# Patient Record
Sex: Female | Born: 1990 | Race: White | Hispanic: No | Marital: Single | Smoking: Former smoker
Health system: Southern US, Community
[De-identification: ages and names within clinical notes are randomized; demographics above are authoritative.]

## PROBLEM LIST (undated history)

## (undated) DIAGNOSIS — I10 Essential (primary) hypertension: Secondary | ICD-10-CM

## (undated) DIAGNOSIS — S4990XA Unspecified injury of shoulder and upper arm, unspecified arm, initial encounter: Secondary | ICD-10-CM

## (undated) DIAGNOSIS — Z7151 Drug abuse counseling and surveillance of drug abuser: Secondary | ICD-10-CM

## (undated) DIAGNOSIS — S99919A Unspecified injury of unspecified ankle, initial encounter: Secondary | ICD-10-CM

## (undated) DIAGNOSIS — R51 Headache: Secondary | ICD-10-CM

## (undated) HISTORY — DX: Drug abuse counseling and surveillance of drug abuser: Z71.51

## (undated) HISTORY — DX: Headache: R51

## (undated) HISTORY — DX: Unspecified injury of unspecified ankle, initial encounter: S99.919A

## (undated) HISTORY — DX: Unspecified injury of shoulder and upper arm, unspecified arm, initial encounter: S49.90XA

---
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
## 2008-03-25 ENCOUNTER — Ambulatory Visit: Payer: Self-pay | Admitting: Internal Medicine

## 2008-03-25 DIAGNOSIS — R599 Enlarged lymph nodes, unspecified: Secondary | ICD-10-CM | POA: Insufficient documentation

## 2008-03-25 DIAGNOSIS — R5383 Other fatigue: Secondary | ICD-10-CM

## 2008-03-25 DIAGNOSIS — R5381 Other malaise: Secondary | ICD-10-CM | POA: Insufficient documentation

## 2008-04-03 LAB — CONVERTED CEMR LAB
Basophils Absolute: 0 10*3/uL (ref 0.0–0.1)
Basophils Relative: 0.1 % (ref 0.0–3.0)
Free T4: 1.1 ng/dL (ref 0.6–1.6)
MCHC: 34.5 g/dL (ref 30.0–36.0)
MCV: 95.8 fL (ref 78.0–100.0)
T3, Free: 4.5 pg/mL — ABNORMAL HIGH (ref 2.3–4.2)
TSH: 4.64 microintl units/mL (ref 0.35–5.50)
WBC: 9.7 10*3/uL (ref 4.5–10.5)

## 2008-05-02 DIAGNOSIS — Z7151 Drug abuse counseling and surveillance of drug abuser: Secondary | ICD-10-CM

## 2008-05-02 HISTORY — DX: Drug abuse counseling and surveillance of drug abuser: Z71.51

## 2008-05-07 ENCOUNTER — Ambulatory Visit: Payer: Self-pay | Admitting: Internal Medicine

## 2008-05-07 LAB — CONVERTED CEMR LAB
T3, Free: 3.2 pg/mL (ref 2.3–4.2)
TSH: 1.9 microintl units/mL (ref 0.35–5.50)

## 2008-05-14 ENCOUNTER — Ambulatory Visit: Payer: Self-pay | Admitting: Internal Medicine

## 2008-05-14 DIAGNOSIS — R946 Abnormal results of thyroid function studies: Secondary | ICD-10-CM | POA: Insufficient documentation

## 2008-05-14 DIAGNOSIS — F4323 Adjustment disorder with mixed anxiety and depressed mood: Secondary | ICD-10-CM

## 2008-05-14 DIAGNOSIS — F39 Unspecified mood [affective] disorder: Secondary | ICD-10-CM

## 2008-06-08 ENCOUNTER — Emergency Department (HOSPITAL_COMMUNITY): Admission: EM | Admit: 2008-06-08 | Discharge: 2008-06-08 | Payer: Self-pay | Admitting: Family Medicine

## 2008-06-18 ENCOUNTER — Telehealth: Payer: Self-pay | Admitting: Internal Medicine

## 2009-07-03 ENCOUNTER — Ambulatory Visit: Payer: Self-pay | Admitting: Internal Medicine

## 2009-07-03 DIAGNOSIS — F17200 Nicotine dependence, unspecified, uncomplicated: Secondary | ICD-10-CM

## 2009-07-03 DIAGNOSIS — F1921 Other psychoactive substance dependence, in remission: Secondary | ICD-10-CM | POA: Insufficient documentation

## 2009-07-03 DIAGNOSIS — J301 Allergic rhinitis due to pollen: Secondary | ICD-10-CM

## 2009-08-18 ENCOUNTER — Telehealth: Payer: Self-pay | Admitting: Internal Medicine

## 2009-08-31 ENCOUNTER — Telehealth: Payer: Self-pay | Admitting: Internal Medicine

## 2009-09-21 ENCOUNTER — Telehealth: Payer: Self-pay | Admitting: *Deleted

## 2009-09-22 ENCOUNTER — Ambulatory Visit: Payer: Self-pay | Admitting: Internal Medicine

## 2009-09-22 ENCOUNTER — Telehealth: Payer: Self-pay | Admitting: Internal Medicine

## 2009-09-22 DIAGNOSIS — H6690 Otitis media, unspecified, unspecified ear: Secondary | ICD-10-CM | POA: Insufficient documentation

## 2009-09-23 ENCOUNTER — Encounter: Payer: Self-pay | Admitting: Internal Medicine

## 2009-10-12 ENCOUNTER — Telehealth: Payer: Self-pay | Admitting: Internal Medicine

## 2010-01-05 ENCOUNTER — Telehealth: Payer: Self-pay | Admitting: Internal Medicine

## 2010-01-08 ENCOUNTER — Ambulatory Visit: Payer: Self-pay | Admitting: Internal Medicine

## 2010-01-08 DIAGNOSIS — K5289 Other specified noninfective gastroenteritis and colitis: Secondary | ICD-10-CM

## 2010-01-08 DIAGNOSIS — R21 Rash and other nonspecific skin eruption: Secondary | ICD-10-CM

## 2010-01-11 ENCOUNTER — Ambulatory Visit: Payer: Self-pay | Admitting: Internal Medicine

## 2010-01-11 DIAGNOSIS — L089 Local infection of the skin and subcutaneous tissue, unspecified: Secondary | ICD-10-CM | POA: Insufficient documentation

## 2010-01-12 ENCOUNTER — Encounter: Payer: Self-pay | Admitting: Internal Medicine

## 2010-06-01 NOTE — Progress Notes (Signed)
Summary: ear pain  Phone Note Call from Patient   Caller: Daughter Call For: Madelin Headings MD Summary of Call: Okey Regal) pt's mother called stating her daughter  [facility].  She is having pain again today, and is asking to see an ENT or Dr. Fabian Sharp.  Explained that tomorrow would be the first appt for Dr. Fabian Sharp, and if she is in a lot of pain, she may go back [facility] for [facility].   All of this was communicated through several voice mails. [phone] Initial call taken by: Lynann Beaver CMA,  Sep 21, 2009 12:42 PM  Follow-up for Phone Call        I can see her tomorrow for this Follow-up by: Nelwyn Salisbury MD,  Sep 21, 2009 4:09 PM  Additional Follow-up for Phone Call Additional follow up Details #1::        SPoke to pt's mom and she is going to [employer] on Wed. Additional Follow-up by: Romualdo Bolk, CMA (AAMA),  Sep 21, 2009 4:11 PM

## 2010-06-01 NOTE — Assessment & Plan Note (Signed)
Summary: painful cyst in vaginal area/ssc   Vital Signs:  Patient profile:   20 year old female Menstrual status:  regular Weight:      127 pounds Temp:     98.7 degrees F oral Pulse rate:   78 / minute BP sitting:   120 / 80  (right arm) Cuff size:   regular  Vitals Entered By: Romualdo Bolk, CMA (AAMA) (January 11, 2010 3:45 PM) CC: Cyst in vaginal area top middle, swollen and painful. Came up on 9/12. Pt's about the size of a small pea and has gone down. Pt saw some pus coming out of it.    History of Present Illness: Kim Brooks comes in today for an acute visit because she has had a sore lump in her vaginal area for two days. She has treated it with nothing. Earlier today there was some mild pussy drainage and the swelling went down on her pain decreased a bit. She's not had this problem before. There is no fever vaginal discharge history of HSV other maybe exposures at some point.   She was unsure how to treat this and comes in today her diarrheal illness is recovered see previous notes.  Preventive Screening-Counseling & Management  Alcohol-Tobacco     Alcohol drinks/day: 0     Smoking Status: current     Packs/Day: 0.5  Caffeine-Diet-Exercise     Caffeine use/day: 3     Does Patient Exercise: yes  Current Medications (verified): 1)  Nasonex 50 Mcg/act Susp (Mometasone Furoate) .... 2 Sprays Each Nostril Q D  For  Allergy  Control 2)  Cetirizine Hcl 10 Mg Tabs (Cetirizine Hcl) .Marland Kitchen.. 1 By Mouth Once Daily For Allergy  Allergies (verified): No Known Drug Allergies  Past History:  Past medical, surgical, family and social histories (including risk factors) reviewed, and no changes noted (except as noted below).  Past Medical History: Reviewed history from 01/08/2010 and no changes required. see  paper chart no change Shoulder ankle injuries  Recent neg testing for HIV  Headache drug opiate rehab  2010     Consults None   Past History:  Care  Management: None Current ENT: [facility]  Family History: Reviewed history from 05/14/2008 and no changes required. DM CV    Alcohol /SUbstance use  Social History: Reviewed history from 01/08/2010 and no changes required. tobacco   1/2 ppd   and caffeine.      hh of 2  no pets.   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  Review of Systems  The patient denies anorexia, fever, abdominal pain, hematuria, incontinence, genital sores, abnormal bleeding, enlarged lymph nodes, and angioedema.    Physical Exam  General:  Well-developed,well-nourished,in no acute distress; alert,appropriate and cooperative throughout examination Abdomen:  Bowel sounds positive,abdomen soft and non-tender without masses, organomegaly or hernias noted. Genitalia:  no vaginal discharge and mucosa pink and moist.  there is a 2 mm subcutaneous red nodule with a point excoriation and no discharge and no fluctuates. There is no ulceration is moderately tender. There is no secondary adenopathy or redness. This is located at the upper right labial area in the pubic region. Pulses:  pulses intact without delay   Extremities:  no clubbing cyanosis or edema  Neurologic:  non focal  Skin:  turgor normal and color normal.   Cervical Nodes:  No lymphadenopathy noted Inguinal Nodes:  No significant adenopathy Psych:  Oriented X3, good eye contact, not  anxious appearing, and not depressed appearing.     Impression & Recommendations:  Problem # 1:  INFECTION, SKIN AND SOFT TISSUE (ICD-686.9) this appears to be more bacterial and not herpetic. It is not an abscess at present but more like a tiny boil. All cultured discharge surface and empirically treat in the meantime.  Expectant management .   Plan follow-up if progressive or persistent Orders: T-Culture, Wound ([id number])  Complete Medication List: 1)  Nasonex 50 Mcg/act Susp (Mometasone furoate) .... 2 sprays each nostril q d   for  allergy  control 2)  Cetirizine Hcl 10 Mg Tabs (Cetirizine hcl) .Marland Kitchen.. 1 by mouth once daily for allergy 3)  Cephalexin 500 Mg Caps (Cephalexin) .Marland Kitchen.. 1 by mouth two times a day  for skin infection  Patient Instructions: 1)  warm/hot  compresses   at  least 3 x per day until better  2)  topical antibiotic such as polysproin. 3)  You will be informed of lab results when available.  4)  oral antibioitic for 5 days ( but may get better with above )  5)  call if not resolving in the next  4-5 days or as needed. Prescriptions: CEPHALEXIN 500 MG CAPS (CEPHALEXIN) 1 by mouth two times a day  for skin infection  #10 x 0   Entered and Authorized by:   Madelin Headings MD   Signed by:   Madelin Headings MD on 01/11/2010   Method used:   Print then Give to Patient   RxID:   [id number]

## 2010-06-01 NOTE — Progress Notes (Signed)
Summary: OOW note  Phone Note Call from Patient   Caller: Patient Call For: Kim Headings MD Summary of Call: Rexene Agent (Mom)  Pt was out of school last week with vomiting, diarrhea and sore throat.  Did not go to the doctor as they felt it was viral. Her college is requiring a note to be out.  Would Dr. Fabian Sharp do this? [phone] Initial call taken by: Lynann Beaver CMA,  January 05, 2010 12:24 PM  Follow-up for Phone Call        I dont  usually write notes without seeing patient or otherwise being involved in care of a given  illness.    Suggest OV . (or  if all better ..usually talk with professors themselves   about situation .)  Follow-up by: Kim Headings MD,  January 06, 2010 9:32 AM  Additional Follow-up for Phone Call Additional follow up Details #1::        Left message on personal voice mail. Additional Follow-up by: Lynann Beaver CMA,  January 06, 2010 11:04 AM

## 2010-06-01 NOTE — Progress Notes (Signed)
Summary: letter of support  Phone Note Call from Patient   Caller: mother,carol [phone] Summary of Call: Need a letter in support of insurance appeal re substance abuse & network exception for her.  You saw her 1st of last year & concerned very depressed.  Lots of in network mental health.  Faxing 3 page appeal she has written, copy coming to you, & request your letter of support for it.   Initial call taken by: Rudy Jew, RN,  August 18, 2009 9:55 AM  Follow-up for Phone Call        letter of appeal in doc folder Follow-up by: Heron Sabins,  August 18, 2009 10:56 AM  Additional Follow-up for Phone Call Additional follow up Details #1::        will do this  after review Additional Follow-up by: Madelin Headings MD,  August 20, 2009 8:01 AM

## 2010-06-01 NOTE — Assessment & Plan Note (Signed)
Summary: F/U POST GI ILLNESS // RS   Vital Signs:  Patient profile:   20 year old female Menstrual status:  regular LMP:     12/26/2009 Weight:      129 pounds Pulse rate:   72 / minute BP sitting:   100 / 70  (right arm) Cuff size:   regular  Vitals Entered By: Romualdo Bolk, CMA (AAMA) (January 08, 2010 1:58 PM) CC: Rash on Left arm, occ it does it itch. Pt has to wear rubber gloves at school. It has been there for 1 week. Pt also needs a md note for school for Aug 29-9/1 due to having nausea, vomiting and diarrhea. Pt states that she felt like she didn't need to come in because it was just a bug that was going around. LMP (date): 12/26/2009 LMP - Character: normal Menarche (age onset years): 10   Menses interval (days): 28 Menstrual flow (days): 5 Enter LMP: 12/26/2009   History of Present Illness: Kim Brooks  comes in today   for above .  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  she needs note for her teachers.   ONset with malaise August 29 and then had  to go home from school with nausea   August 30th .      Persistent vomiting, diarrhea  occurred   august 31 and sept 1  and stayed home .     took pepto.    vomiting stopped aafter  3 days .   Slow improvement and although tired went back to school this wekk    No diarrhea for the past  2 days.      Friend in recovery group got sick.    no other exposures or travel. No work outside of school.  LMP last week.   Also has  itchy  antecupbital rash  left    . she wears  long rubber gloves . at school . no known latex allergy. no fever  no other rashes.     Preventive Screening-Counseling & Management  Alcohol-Tobacco     Alcohol drinks/day: 0     Smoking Status: current     Packs/Day: 0.5  Caffeine-Diet-Exercise     Caffeine use/day: 3     Does Patient Exercise: yes  Current Medications (verified): 1)  Nasonex 50 Mcg/act Susp (Mometasone Furoate)  .... 2 Sprays Each Nostril Q D  For  Allergy  Control 2)  Cetirizine Hcl 10 Mg Tabs (Cetirizine Hcl) .Marland Kitchen.. 1 By Mouth Once Daily For Allergy  Allergies (verified): No Known Drug Allergies  Past History:  Past medical, surgical, family and social histories (including risk factors) reviewed for relevance to current acute and chronic problems.  Past Medical History: see  paper chart no change Shoulder ankle injuries  Recent neg testing for HIV  Headache drug opiate rehab  2010     Consults None   Past History:  Care Management: None Current ENT: [facility]  Family History: Reviewed history from 05/14/2008 and no changes required. DM CV    Alcohol /SUbstance use  Social History: Reviewed history from 07/03/2009 and no changes required. tobacco   1/2 ppd   and caffeine.      hh of 2  no pets.   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  Review of Systems  The patient denies fever, chest pain, melena, hematochezia, severe indigestion/heartburn,  hematuria, difficulty walking, and abnormal bleeding.         fatigue dizziness now better   Physical Exam  General:  Well-developed,well-nourished,in no acute distress; alert,appropriate and cooperative throughout examination Head:  normocephalic and atraumatic.   Eyes:  vision grossly intact, pupils equal, and pupils round.   Nose:  no external deformity and no external erythema.   Mouth:  pharynx pink and moist.  no lesions Neck:  No deformities, masses, or tenderness noted. Lungs:  Normal respiratory effort, chest expands symmetrically. Lungs are clear to auscultation, no crackles or wheezes. Heart:  Normal rate and regular rhythm. S1 and S2 normal without gallop, murmur, click, rub or other extra sounds. Abdomen:  Bowel sounds positive,abdomen soft and non-tender without masses, organomegaly or hernias noted. Pulses:  nl cap refill  Extremities:  no clubbing cyanosis or edema  Neurologic:  non  focal  Skin:  turgor normal and color normal.  left antecubital area with papular  rash no pustules   or vesicles  Cervical Nodes:  small left ac node mobile ( old) nontender Axillary Nodes:  No palpable lymphadenopathy Psych:  Oriented X3, normally interactive, good eye contact, not anxious appearing, and not depressed appearing.     Impression & Recommendations:  Problem # 1:  GASTROENTERITIS (ICD-558.9) convalescing   agree with the 3 days taken from school   in a physical curriculum .   Expectant management   Problem # 2:  RASH (ICD-782.1) poss contact derm    use otc for now  and call if persistent or  progressive   for poss stronger steroid topical   Problem # 3:  TOBACCO USE (ICD-305.1)  disc dc     in context .     Orders: Tobacco use cessation intermediate 3-10 minutes (99406)  Complete Medication List: 1)  Nasonex 50 Mcg/act Susp (Mometasone furoate) .... 2 sprays each nostril q d  for  allergy  control 2)  Cetirizine Hcl 10 Mg Tabs (Cetirizine hcl) .Marland Kitchen.. 1 by mouth once daily for allergy  Patient Instructions: 1)  can use  hydrocortisone   1%  two times a day  for the rash  call if not helping after 1-2 weeks orf rx. 2)  Advance diet as tolerated  call if GI symptoms return.

## 2010-06-01 NOTE — Progress Notes (Signed)
Summary: letter of support  Phone Note Call from Patient Call back at [phone]   Caller: Mom-Carol Summary of Call: Mom is calling about the letter. Mom is going to write the letter up and email it to [place] to let us know what needs to be said. Initial call taken by: Romualdo Bolk, CMA (AAMA),  October 12, 2009 10:23 AM

## 2010-06-01 NOTE — Assessment & Plan Note (Signed)
Summary: ear pain/dm  of History of Present Illness: 20 year old patient, who is in today complaining of bilateral ear pain.  She has been at the urgent care a number of times and has been treated with multiple antibiotics.  Over the past few months.  She is scheduled for ENT evaluation tomorrow.  She complains of ear pain, but no drainage.  She does have a history of substance abuse in the past.    Allergies: No Known Drug Allergies  Past History:  Past Medical History: Reviewed history from 07/03/2009 and no changes required. see  paper chart no change Shoulder ankle injuries  Recent neg testing for HIV  Headache drug opiate rehab  2010    Consults None   Physical Exam  General:  Well-developed,well-nourished,in no acute distress; alert,appropriate and cooperative throughout examination Head:  Normocephalic and atraumatic without obvious abnormalities. No apparent alopecia or balding. Eyes:  No corneal or conjunctival inflammation noted. EOMI. Perrla. Funduscopic exam benign, without hemorrhages, exudates or papilledema. Vision grossly normal. Ears:  both tympanic membranes appeared to be dull and thickened, but no active inflammation; exam obscured somewhat by the presence of ear drops; there is no pre-or postauricular adenopathy Mouth:  Oral mucosa and oropharynx without lesions or exudates.  Teeth in good repair. Neck:  No deformities, masses, or tenderness noted. Lungs:  Normal respiratory effort, chest expands symmetrically. Lungs are clear to auscultation, no crackles or wheezes.   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 2 sprays each nostril q d  for  allergy  control 2)  Cetirizine Hcl 10 Mg Tabs (Cetirizine hcl) .Marland Kitchen.. 1 by mouth once daily for  allergy 3)  Auroto 1.4-5.4 % Soln (Benzocaine-antipyrine) .... Two drops to both ears 4 times daily  Patient Instructions: 1)  ENT follow up tomorrow as scheduled Prescriptions: AUROTO 1.4-5.4 % SOLN (BENZOCAINE-ANTIPYRINE) two drops to both ears 4 times daily  #20 cc x 0   Entered and Authorized by:   Gordy Savers  MD   Signed by:   Gordy Savers  MD on 09/22/2009   Method used:   Electronically to        [facility] [address] [phone]* (retail)       [address]       [place], [place]  [address]       Ph: [phone] or [phone]       Fax: [phone]   RxID:   [id number]

## 2010-06-01 NOTE — Consult Note (Signed)
Summary: [facility]  [facility]   Imported By: Maryln Gottron 01/19/2010 12:59:15  _____________________________________________________________________  External Attachment:    Type:   Image     Comment:   External Document

## 2010-06-01 NOTE — Progress Notes (Signed)
Summary: letter  Phone Note Call from Patient Call back at [phone]   Caller: Carol-mom Summary of Call: Mom is wanting to know if letter is ready.  Initial call taken by: Romualdo Bolk, CMA Duncan Dull),  Aug 31, 2009 4:06 PM  Follow-up for Phone Call        NOt yet  will call when ready Follow-up by: Madelin Headings MD,  Sep 13, 2009 8:44 AM     Appended Document: letter Patient's Mom aware of we will call when letter is done

## 2010-06-01 NOTE — Assessment & Plan Note (Signed)
Summary: ALLERGIES? // RS   Vital Signs:  Patient profile:   20 year old female Menstrual status:  regular LMP:     06/23/2009 Height:      66 inches Weight:      133 pounds BMI:     21.54 Temp:     98.2 degrees F oral Pulse rate:   66 / minute BP sitting:   120 / 80  (right arm) Cuff size:   regular  Vitals Entered By: Romualdo Bolk, CMA (AAMA) (July 03, 2009 10:25 AM) CC: Discuss going on allergy medication. Pt is having sneezing, watery eyes. Pt is currently on amoxil for a ear infection. Pt was seen at a Urgent care for this. LMP (date): 06/23/2009 LMP - Character: normal Menarche (age onset years): 10   Menses interval (days): 28 Menstrual flow (days): 5 Menstrual Status regular Enter LMP: 06/23/2009   History of Present Illness: Kim Brooks comesin today because of seasonal allergies spring usually that  flares every year.    getting early symptom ..see above. Was seen in urgent care recently for  left ear infection  on antibiotic for 2 days  .  No cough no asthma signs   went through rehab 6 mos ago and drug ( opiate pilllmed ) free at presnt.    Preventive Screening-Counseling & Management  Alcohol-Tobacco     Alcohol drinks/day: 0     Smoking Status: current     Packs/Day: 0.5  Caffeine-Diet-Exercise     Caffeine use/day: 3     Does Patient Exercise: yes  Current Medications (verified): 1)  None  Allergies (verified): No Known Drug Allergies  Past History:  Past medical, surgical, family and social histories (including risk factors) reviewed, and no changes noted (except as noted below).  Past Medical History: see  paper chart no change Shoulder ankle injuries  Recent neg testing for HIV  Headache drug opiate rehab  2010    Consults None   Past History:  Care Management: None Current  Family History: Reviewed history from 05/14/2008 and no changes required. DM CV    Alcohol /SUbstance use  Social History: Reviewed history  from 05/14/2008 and no changes required. tobacco   1/2 ppd   and caffeine.      hh of 2  no pets.   living at  home job   seeking. Packs/Day:  0.5 Caffeine use/day:  3 Does Patient Exercise:  yes  Review of Systems  The patient denies anorexia, fever, weight loss, weight gain, vision loss, hoarseness, chest pain, syncope, dyspnea on exertion, prolonged cough, hemoptysis, abdominal pain, melena, hematochezia, severe indigestion/heartburn, hematuria, incontinence, muscle weakness, unusual weight change, abnormal bleeding, and angioedema.         ln in neck about the same  Physical Exam  General:  Well-developed,well-nourished,in no acute distress; alert,appropriate and cooperative throughout examination Head:  normocephalic and atraumatic.   Eyes:  vision grossly intact, pupils equal, and pupils round.   Ears:  R ear normal.  left ear distorted and pink red   bone ok  Nose:  no external deformity, no external erythema, and no nasal discharge.  face on tender Mouth:  pharynx pink and moist.   Neck:  No deformities, masses, or tenderness noted.  left ac node pear sized non tendern Lungs:  Normal respiratory effort, chest expands symmetrically. Lungs are clear to auscultation, no crackles or wheezes. Heart:  Normal rate and regular rhythm. S1 and S2 normal without gallop, murmur, click,  rub or other extra sounds. Abdomen:  soft, non-tender, normal bowel sounds, no distention, no masses, no guarding, no rigidity, no hepatomegaly, and no splenomegaly.   Skin:  turgor normal, color normal, no petechiae, and no purpura.   Cervical Nodes:  no posterior cervical adenopathy.  non tendern  left ac node  old no change  1 cm sice  Psych:  Oriented X3, good eye contact, not anxious appearing, and not depressed appearing.     Impression & Recommendations:  Problem # 1:  ALLERGIC RHINITIS, SEASONAL (ICD-477.0) seems to be spring and starting   Problem # 2:  OTITIS MEDIA (ICD-382.9) Assessment:  Improved by hx left   still on antibiotic.  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 2 sprays each nostril q d  for  allergy  control 2)  Cetirizine Hcl 10 Mg Tabs (Cetirizine hcl) .Marland Kitchen.. 1 by mouth once daily for allergy  Patient Instructions: 1)  take nasal cortisone every day to prevent  and control allergy flares.This can also help with  your eye symptoms. 2)  can take   zyrtec  antihistamin also  can do this as needed.   3)  Call in  2-3 weeks and if not controlled we may add other medication.  4)  Finish your antibioitic for ear infection. Prescriptions: CETIRIZINE HCL 10 MG TABS (CETIRIZINE HCL) 1 by mouth once daily for allergy  #30 x 12   Entered and Authorized by:   Madelin Headings MD   Signed by:   Madelin Headings MD on 07/03/2009   Method used:   Print then Give to Patient   RxID:   [id number] NASONEX 50 MCG/ACT SUSP (MOMETASONE FUROATE) 2 sprays each nostril q d  for  allergy  control  #1 x 12   Entered and Authorized by:   Madelin Headings MD   Signed by:   Madelin Headings MD on 07/03/2009   Method used:   Print then Give to Patient   RxID:   [id number]   Prevention & Chronic Care Immunizations   Influenza vaccine: Not documented    Tetanus booster: Not documented    Pneumococcal vaccine: Not documented  Other Screening   Pap smear: Not documented   Smoking status: current  (07/03/2009)

## 2010-06-01 NOTE — Progress Notes (Signed)
Summary: FYI  Phone Note Call from Patient   Caller: Mom Call For: Kim Brooks Headings MD Summary of Call: Pt is seeing Dr. Kirtland Bouchard today for ear pain, and Mom wants him to know she in recovery (re: narcotics), and DO NOT GIVE HER NARCOTICS! Initial call taken by: Lynann Beaver CMA,  Sep 22, 2009 1:08 PM  Follow-up for Phone Call        noted - msg printed for Dr. Amador Cunas to review Follow-up by: Duard Brady LPN,  Sep 22, 2009 4:44 PM

## 2010-06-01 NOTE — Letter (Signed)
Summary: Out of School  [employer] [facility] [employer] [employer]  [address] [place], [place] [address]   Phone: [phone]  Fax: [phone]    January 08, 2010   Student:  Marlena Clipper    To Whom It May Concern:   For Medical reasons, please excuse the above named student from school for the following dates:  Start:   December 29, 2009  End:    January 01, 2010   If you need additional information, please feel free to contact our office.   Sincerely,    Madelin Headings MD    ****This is a legal document and cannot be tampered with.  Schools are authorized to verify all information and to do so accordingly.

## 2010-09-17 NOTE — Assessment & Plan Note (Signed)
[facility]                            [facility] OFFICE NOTE   NAME:Mcafee, Shaleta                        MRN:          [id number]  DATE:06/16/2006                            DOB:          07-14-1990    CHIEF COMPLAINT:  New patient to get established.   HISTORY OF PRESENT ILLNESS:  Ms. Lao is an 20 year old 1/2 to 1 pack  per day single white female who comes in today for a first time visit.  Her mother is also establishing at our practice.  She has not had a  check up for a couple of years, establishing with our practice.  In  general, she has been healthy, although she does have a diagnosis of  asthma when she exercises.  She has also had some headaches that occur  about once a week for which she takes ibuprofen as needed.  She has had  no surgeries or hospitalizations.  She does carry a diagnosis of ADHD  where she was retained from school in third grade and was noted to also  have an LD in the reading area.  She did get some extra help, however,  she has dropped out of high school twice, once in the second semester of  her freshman year, most recently in the fall of this past year which was  considered a sophomore year.  She is planning, however, to finish and  get her GED and take the test and move on to a community college and is  interested in photography.   PAST MEDICAL HISTORY:  See data base and above.   INJURIES:  Left ankle, left shoulder, history of dislocation, pops at  times, no surgery required.   Last tetanus shot was in 2002.  She has never had a Pap smear.   REVIEW OF SYSTEMS:  Negative for chest pain, shortness of breath except  for sometimes wheezes with exercise.  She does have dry skin, question  eczema.  She has a history of some back pain that flares up at times,  but does well when she exercises.  Periods are about one a month, lasts  4-5 days, without difficulty.   FAMILY HISTORY:  Positive for AED, substance  abuse, alcohol, perhaps  depression and bipolar disease in her family.  Both grandparents died  from complications of heart disease, cardiomyopathy in maternal  grandmother, and type 2 diabetes and stroke in maternal grandfather.  A  grandparent, I believe, had breast cancer.   SOCIAL HISTORY:  Lives at home with mother. 9-10 hours of sleep.  Alcohol not often.  1/2 pack to 1 pack per day of tobacco for 2-3  years, it is hard to stop.  Caffeine lots of Pepsi.  Negative for  other mind altering substances.   MEDICATIONS:  None, although she has been on Adderall and Abilify in the  past, she prefers no medications because she feels better without it.   DRUG ALLERGIES:  None recorded.   OBJECTIVE:  VITAL SIGNS:  Height 5 foot 5 1/2 inches, weight 118, pulse  60 and regular, blood pressure  120/80.  GENERAL:  Well developed, well nourished, slender but healthy appearing  20 year old in no acute distress.  HEENT:  Grossly normal.  Eyes PERL.  OP clear.  There is piercing on the  face.  NECK:  Supple without masses, thyromegaly, or bruit.  CHEST:  Clear to auscultation and equal.  CARDIAC:  S1 and S2, no gallops or murmurs are heard.  ABDOMEN:  Soft, no organomegaly, guarding or rebound.  EXTREMITIES:  No obvious atrophy or edema.  Gait is within normal  limits.  SKIN:  No acute rashes or jaundice.   IMPRESSION:  1. 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  2. Tobacco dependence with strong family history.  3. What sounds like exercise induced asthma.  4. Headaches that do not sound problematic at present.  5. Health care maintenance need.  I did discuss appropriate nutrition      including calcium and vitamin D in her diet as she does not like      diet.  Appropriate exercise to continue.  Counseled at least 15      minutes on tobacco cessation.  6. Family history of vascular disease and diabetes.   PLAN:  Will get fasting labs to include lipids, blood sugar, thyroid,  etc., and have her follow up visit  after that to address any other  needs.  She should get some information about her previous  immunizations, that she may benefit from meningitis vaccine.     Neta Mends. Panosh, MD  Electronically Signed    WKP/MedQ  DD: 06/16/2006  DT: 06/16/2006  Job #: [id number]

## 2011-02-10 LAB — POCT URINALYSIS DIP (DEVICE)
Nitrite: POSITIVE — AB
Operator id: 18918820
Protein, ur: >=300 — AB
Specific Gravity, Urine: >=1.03
Urobilinogen, UA: 1
pH: 5.5

## 2011-02-10 LAB — POCT PREGNANCY, URINE
Operator id: 235561
Preg Test, Ur: NEGATIVE

## 2011-03-31 ENCOUNTER — Encounter: Payer: Self-pay | Admitting: Internal Medicine

## 2011-04-01 ENCOUNTER — Encounter: Payer: Self-pay | Admitting: Internal Medicine

## 2011-04-01 ENCOUNTER — Ambulatory Visit (INDEPENDENT_AMBULATORY_CARE_PROVIDER_SITE_OTHER): Payer: 59 | Admitting: Internal Medicine

## 2011-04-01 VITALS — BP 110/70 | HR 72 | Temp 98.6°F | Wt 122.0 lb

## 2011-04-01 DIAGNOSIS — R1013 Epigastric pain: Secondary | ICD-10-CM

## 2011-04-01 DIAGNOSIS — R634 Abnormal weight loss: Secondary | ICD-10-CM

## 2011-04-01 DIAGNOSIS — F17200 Nicotine dependence, unspecified, uncomplicated: Secondary | ICD-10-CM

## 2011-04-01 LAB — CBC WITH DIFFERENTIAL/PLATELET
Basophils Absolute: 0 10*3/uL (ref 0.0–0.1)
Eosinophils Absolute: 0.1 10*3/uL (ref 0.0–0.7)
HCT: 44 % (ref 36.0–46.0)
Lymphs Abs: 1.7 10*3/uL (ref 0.7–4.0)
MCHC: 34.2 g/dL (ref 30.0–36.0)
MCV: 92.8 fl (ref 78.0–100.0)
Neutro Abs: 3.9 10*3/uL (ref 1.4–7.7)
Neutrophils Relative %: 64.2 % (ref 43.0–77.0)
RBC: 4.74 Mil/uL (ref 3.87–5.11)

## 2011-04-01 LAB — HEPATIC FUNCTION PANEL
ALT: 12 U/L (ref 0–35)
AST: 19 U/L (ref 0–37)
Alkaline Phosphatase: 66 U/L (ref 39–117)
Bilirubin, Direct: 0 mg/dL (ref 0.0–0.3)

## 2011-04-01 LAB — BASIC METABOLIC PANEL
BUN: 10 mg/dL (ref 6–23)
CO2: 27 mEq/L (ref 19–32)
Chloride: 104 mEq/L (ref 96–112)
GFR: 70.66 mL/min (ref >60.00)

## 2011-04-01 LAB — SEDIMENTATION RATE: Sed Rate: 4 mm/hr (ref 0–22)

## 2011-04-01 NOTE — Patient Instructions (Signed)
Will notify you  of labs when available. In the meantime begin omeprazole OTC every day pre meal.  Will contact you about getting and ultrasound of the abdomen.  And then probably Gi referral.  Avoid advil aleve asa type meds  . These can cause ulcers and gastritis.

## 2011-04-01 NOTE — Progress Notes (Signed)
  Subjective:    Patient ID: Kim Brooks, female    DOB: December 23, 1990, 20 y.o.   MRN: [id number]  HPI  Comes in for new problem evaluation.  C/O with  2-3 weeks of abd pain epigastrum and "bottom of stomach" coming and going ;worse with eating. . Worse after eating  30 minutes or so. Associated with Nausea no vomiting   No diarrhea  Some tarring texture  Dark  Brown color.  But no black or blood . Also anorexia and about 10 weight loss. No nsaid on a reg basis etoh, asa  Excess caffeine and no etoh.   Still recovering and abstinent and in a support group doing well.  Review of Systems Periods are normal  LMP today.  No uti fever sweats  . No GU sx   NO cp sob vision hearing issues. No joint  Physical limitations otherwise    Looking for job.  2 room mates  No pets  tobacco trying electronic  cigs 3 per day/     Objective:   Physical Exam Physical Exam: Vital signs reviewed WUJ:WJXB is a well-developed slender but well appearing  alert cooperative  white female who appears her stated age in no acute distress.  HEENT: normocephalic  traumatic , Eyes: PERRL EOM's full, conjunctiva clear, Nares: paten,t no deformity discharge or tenderness., Ears: no deformity EAC's clear TMs with normal landmarks. Mouth: clear OP, no lesions, edema.  Moist mucous membranes. Dentition in adequate repair. NECK: supple without masses, or bruits.  Thyroid palp but no nodules shoddy ln CHEST/PULM:  Clear to auscultation and percussion breath sounds equal no wheeze , rales or rhonchi. No chest wall deformities or tenderness. CV: PMI is nondisplaced, S1 S2 no gallops, murmurs, rubs. Peripheral pulses are full without delay.No JVD .  ABDOMEN: Bowel sounds normal nontender but uncomfortable at epigastric .  No guard or rebound, no hepatosplenomegal no CVA tenderness.  No hernia. Extremtities:  No clubbing cyanosis or edema, no acute joint swelling or redness no focal atrophy NEURO:  Oriented x3, cranial nerves 3-12  appear to be intact, no obvious focal weakness,gait within normal limits no abnormal reflexes or asymmetrical SKIN: No acute rashes normal turgor, color, no bruising or petechiae. Multiple tatoos PSYCH: Oriented, good eye contact, no obvious depression anxiety, cognition and judgment appear normal.     Assessment & Plan:  Post prandial abd pain epigastric ,mostly with reported weight loss and anorexia.   No sig risk  factors for ulcer  Other causes . Reviewed  This ? Gastritis ? Cause  Plan further work up  Labs [place] and probably gi consult.  ppi in the  Interim  Continues substance free Trying to limit tobacco

## 2011-04-02 LAB — HIV ANTIBODY (ROUTINE TESTING W REFLEX): HIV: NONREACTIVE

## 2011-04-04 LAB — TSH: TSH: 1.19 u[IU]/mL (ref 0.35–5.50)

## 2011-04-04 LAB — H. PYLORI ANTIBODY, IGG: H Pylori IgG: NEGATIVE

## 2011-04-06 ENCOUNTER — Encounter: Payer: Self-pay | Admitting: *Deleted

## 2011-04-06 NOTE — Progress Notes (Signed)
Quick Note:    Letter sent to pt.  ______

## 2011-07-10 ENCOUNTER — Emergency Department (INDEPENDENT_AMBULATORY_CARE_PROVIDER_SITE_OTHER)
Admission: EM | Admit: 2011-07-10 | Discharge: 2011-07-10 | Disposition: A | Discharge status: Home / Self Care | Payer: 59 | Source: Home / Self Care | Attending: Emergency Medicine | Admitting: Emergency Medicine

## 2011-07-10 ENCOUNTER — Encounter (HOSPITAL_COMMUNITY): Payer: Self-pay

## 2011-07-10 DIAGNOSIS — G43909 Migraine, unspecified, not intractable, without status migrainosus: Secondary | ICD-10-CM

## 2011-07-10 MED ORDER — KETOROLAC TROMETHAMINE 30 MG/ML IJ SOLN
30.0000 mg | Freq: Once | INTRAMUSCULAR | Status: AC
  Administered 2011-07-10: 30 mg via INTRAMUSCULAR

## 2011-07-10 MED ORDER — KETOROLAC TROMETHAMINE 30 MG/ML IJ SOLN
INTRAMUSCULAR | Status: AC
  Filled 2011-07-10: qty 1

## 2011-07-10 MED ORDER — DEXAMETHASONE SODIUM PHOSPHATE 10 MG/ML IJ SOLN
INTRAMUSCULAR | Status: AC
  Filled 2011-07-10: qty 1

## 2011-07-10 MED ORDER — SUMATRIPTAN SUCCINATE 50 MG PO TABS: 50.0000 mg | ORAL_TABLET | ORAL | Status: AC | PRN

## 2011-07-10 MED ORDER — DIPHENHYDRAMINE HCL 50 MG/ML IJ SOLN: 25.0000 mg | Freq: Once | INTRAMUSCULAR | Status: DC

## 2011-07-10 MED ORDER — DEXAMETHASONE SODIUM PHOSPHATE 10 MG/ML IJ SOLN
10.0000 mg | Freq: Once | INTRAMUSCULAR | Status: AC
  Administered 2011-07-10: 10 mg via INTRAMUSCULAR

## 2011-07-10 MED ORDER — DIPHENHYDRAMINE HCL 50 MG/ML IJ SOLN
INTRAMUSCULAR | Status: AC
  Filled 2011-07-10: qty 1

## 2011-07-10 MED ORDER — DIPHENHYDRAMINE HCL 50 MG/ML IJ SOLN
25.0000 mg | Freq: Once | INTRAMUSCULAR | Status: AC
  Administered 2011-07-10: 25 mg via INTRAMUSCULAR

## 2011-07-10 NOTE — ED Notes (Signed)
Pt has headache that started on Thursday and is located over lt eye.

## 2011-07-10 NOTE — ED Provider Notes (Signed)
Chief Complaint  Patient presents with  . Headache    History of Present Illness:   Kim Brooks is a 21 year old female who has a history since yesterday of a migraine type headache. The only obvious triggering factor she can think of is the fact that she's on her menses right now. She has had migraines for several years. Usually treats these with over-the-counter medications. Today the pains didn't go away. The pain is localized on the left side of the head, behind the eye. It feels like a pressure or throbbing. She feels nauseated but has not vomited. She notes photophobia and phonophobia. Activity makes it worse. She denies any visual symptoms. No neurological symptoms. The patient is in a drug rehabilitation program and prefers not to use narcotic medication.  Review of Systems:  Other than noted above, the patient denies any of the following symptoms: Systemic:  No fever, chills, fatigue, photophobia, stiff neck. Eye:  No redness, eye pain, discharge, blurred vision, or diplopia. ENT:  No nasal congestion, rhinorrhea, sinus pressure or pain, sneezing, earache, or sore throat.  No jaw claudication. Neuro:  No paresthesias, loss of consciousness, seizure activity, muscle weakness, trouble with coordination or gait, trouble speaking or swallowing. Psych:  No depression, anxiety or trouble sleeping.  PMFSH:  Past medical history, family history, social history, meds, and allergies were reviewed.  Physical Exam:   Vital signs:  BP 136/80  Pulse 60  Temp(Src) 98.2 F (36.8 C) (Oral)  Resp 15  SpO2 98% General:  Alert and oriented.  In no distress. Eye:  Lids and conjunctivas normal.  PERRL,  Full EOMs.  Fundi benign with normal discs and vessels. ENT:  No cranial or facial tenderness to palpation.  TMs and canals clear.  Nasal mucosa was normal and uncongested without any drainage. No intra oral lesions, pharynx clear, mucous membranes moist, dentition normal. Neck:  Supple, full ROM, no  tenderness to palpation.  No adenopathy or mass. Neuro:  Alert and orented times 3.  Speech was clear, fluent, and appropriate.  Cranial nerves intact. No pronator drift, muscle strength normal. Finger to nose normal.  DTRs 2+ .Station and gait were normal.  Romberg's sign was normal.  Able to perform tandem gait well. Psych:  Normal affect.  Medications given in [facility]:  The patient was given Benadryl 25 mg IM, Decadron 10 mg IM, and Toradol 30 mg IM. She tolerated these without any immediate side effects.  Assessment:   Diagnoses that have been ruled out:  None  Diagnoses that are still under consideration:  None  Final diagnoses:  Migraine headache    Plan:   1.  The following meds were prescribed:   New Prescriptions   SUMATRIPTAN (IMITREX) 50 MG TABLET    Take 1 tablet (50 mg total) by mouth every 2 (two) hours as needed for migraine.   2.  The patient was instructed in symptomatic care and handouts were given. 3.  The patient was told to return if becoming worse in any way, if no better in 3 or 4 days, and given some red flag symptoms that would indicate earlier return.    Reuben Likes, MD 07/10/11 [phone]

## 2011-07-10 NOTE — Discharge Instructions (Signed)

## 2011-07-14 NOTE — Progress Notes (Signed)
This encounter was created in error - please disregard.

## 2011-08-16 ENCOUNTER — Telehealth: Payer: Self-pay | Admitting: Internal Medicine

## 2011-08-16 NOTE — Telephone Encounter (Signed)
Pts mom called and said that some labs that were done on 04/01/11, were not covered by pts insurance due to coding.

## 2011-08-16 NOTE — Telephone Encounter (Signed)
Tim Lair, I am forwarding this one to you. Thanks.

## 2011-09-07 ENCOUNTER — Encounter: Payer: Self-pay | Admitting: Internal Medicine

## 2011-09-07 ENCOUNTER — Ambulatory Visit (INDEPENDENT_AMBULATORY_CARE_PROVIDER_SITE_OTHER): Payer: 59 | Admitting: Internal Medicine

## 2011-09-07 VITALS — BP 104/76 | HR 108 | Temp 98.5°F | Wt 122.0 lb

## 2011-09-07 DIAGNOSIS — F1921 Other psychoactive substance dependence, in remission: Secondary | ICD-10-CM

## 2011-09-07 DIAGNOSIS — F17200 Nicotine dependence, unspecified, uncomplicated: Secondary | ICD-10-CM

## 2011-09-07 DIAGNOSIS — R109 Unspecified abdominal pain: Secondary | ICD-10-CM

## 2011-09-07 LAB — POCT URINALYSIS DIPSTICK
Glucose, UA: NEGATIVE
Ketones, UA: NEGATIVE
Spec Grav, UA: 1.02
pH, UA: 6

## 2011-09-07 MED ORDER — OMEPRAZOLE 20 MG PO CPDR: 20.0000 mg | DELAYED_RELEASE_CAPSULE | Freq: Every day | ORAL | Status: DC

## 2011-09-07 NOTE — Patient Instructions (Signed)
Avoid advil/ aleve if possible don't know why last ultrasound not done but will reorder this. This may be some type of gastritis .  Add acid blocker   prilosec for now once a day  Follow up in 3 weeks or so

## 2011-09-07 NOTE — Progress Notes (Signed)
  Subjective:    Patient ID: Kim Brooks, female    DOB: 03-Jul-1990, 21 y.o.   MRN: [id number]  HPI Patient comes in today on the schedule as an acute visit. Her last visit was in the fall. At that time she had a different kind of abdominal pain that resolved on its. Today she comes in for acute onset today abdominal pain in the middle of her stomach without associated symptoms. She describes them as sharp and occurring about every 15-20 minutes they're severe and she's worried about him. They do not radiate to the back nor the leg no UTI symptoms or GYN symptoms.  She has been taking some Advil Aleve for headaches but not last week.  Review of last visit showed normal laboratory studies and apparently ultrasound never got done. Eventually  No one ever called her a\back.   Periods   Gets lots of cramps. ? Makes it  Worse.   Was on zantac in the past  6 months ago. But no longer on any kind of stomach medicine she has no history of rectal bleeding or documented ulcer.  Review of Systems  No chest pain shortness of breath syncope she is still substance free except for limited tobacco 1/2 pp d  Lots  Of caffiene  No etoh. Some more recently      Working HT.  Past history family history social history reviewed in the electronic medical record.     Objective:   Physical Exam BP 104/76  Pulse 108  Temp(Src) 98.5 F (36.9 C) (Oral)  Wt 122 lb (55.339 kg)  SpO2 98%  LMP 08/24/2011 Well-developed well-nourished but slender in no acute distress looks well HEENT is grossly normal neck supple without adenopathy or bruit thyroid is palpable. Chest CTA BS equal cardiac S1-S2 gallops murmurs Abdomen soft without organomegaly area of tenderness above the umbilicus in the epigastric area and maybe to the left there is no guarding or rebound no flank pain or psoas sign. Bowel sounds are normal No clubbing cyanosis or edema Skin: normal capillary refill ,turgor , color: No acute rashes  ,petechiae or bruising tatoos Neuro grossly nonfocal.     Assessment & Plan:    New onset episodic epigastric pain sounds GI related  even though she hasn't been taking a lot of perhaps related to anti-inflammatories gastritis etc. I would go ahead and reorder the ultrasound it didn't get done last time no laboratory studies today except for urine which shows trace heme but her symptoms are not consistent with renal stones.  We'll begin acid suppression and have her followup in about 3 weeks or so if however she is getting persistent progressive symptoms she should return or contact us consider CT scan. And/or GI consult.

## 2011-09-12 ENCOUNTER — Ambulatory Visit
Admission: RE | Admit: 2011-09-12 | Discharge: 2011-09-12 | Disposition: A | Discharge status: Home / Self Care | Payer: 59 | Source: Ambulatory Visit | Attending: Internal Medicine | Admitting: Internal Medicine

## 2011-09-12 DIAGNOSIS — R109 Unspecified abdominal pain: Secondary | ICD-10-CM

## 2011-09-14 ENCOUNTER — Encounter: Payer: Self-pay | Admitting: Internal Medicine

## 2011-09-14 NOTE — Progress Notes (Signed)
Quick Note:  Attempt to call - VM - LMTCB if questions or still having sx - letter sent to home address. ______

## 2012-06-23 ENCOUNTER — Emergency Department (HOSPITAL_COMMUNITY)
Admission: EM | Admit: 2012-06-23 | Discharge: 2012-06-23 | Disposition: A | Discharge status: Home / Self Care | Payer: 59 | Source: Home / Self Care | Attending: Family Medicine | Admitting: Family Medicine

## 2012-06-23 ENCOUNTER — Encounter (HOSPITAL_COMMUNITY): Payer: Self-pay | Admitting: *Deleted

## 2012-06-23 ENCOUNTER — Emergency Department (INDEPENDENT_AMBULATORY_CARE_PROVIDER_SITE_OTHER): Payer: 59

## 2012-06-23 DIAGNOSIS — S6390XA Sprain of unspecified part of unspecified wrist and hand, initial encounter: Secondary | ICD-10-CM

## 2012-06-23 MED ORDER — IBUPROFEN 800 MG PO TABS: 800.0000 mg | ORAL_TABLET | Freq: Three times a day (TID) | ORAL | Status: DC

## 2012-06-23 NOTE — ED Notes (Signed)
Pt  Has  Pain  r  Thumb   X  1  Week  denys  Any  Injury    romis  Present  However  Has  Pain     On palpation   No  Obvious  Deformity  noted

## 2012-06-23 NOTE — ED Provider Notes (Signed)
History     CSN: [id number]  Arrival date & time 06/23/12  1623   First MD Initiated Contact with Patient 06/23/12 1653      Chief Complaint  Patient presents with  . Hand Pain    (Consider location/radiation/quality/duration/timing/severity/associated sxs/prior treatment) Patient is a 22 y.o. female presenting with hand pain. The history is provided by the patient.  Hand Pain This is a new problem. The current episode started more than 1 week ago ([facility] however says it may have occurred during snow storm play.). The problem has been gradually worsening.    Past Medical History  Diagnosis Date  . Shoulder injury   . Ankle injuries   . Headache   . Encounter for drug rehabilitation 2010    opiate    History reviewed. No pertinent past surgical history.  Family History  Problem Relation Age of Onset  . Diabetes    . Alcohol abuse    . Drug abuse    . Other      cv    History  Substance Use Topics  . Smoking status: Current Every Day Smoker -- 0.50 packs/day  . Smokeless tobacco: Not on file  . Alcohol Use: Yes    OB History   Grav Para Term Preterm Abortions TAB SAB Ect Mult Living                  Review of Systems  Constitutional: Negative.   Musculoskeletal: Positive for joint swelling.  Skin: Negative.     Allergies  Review of patient's allergies indicates no known allergies.  Home Medications   Current Outpatient Rx  Name  Route  Sig  Dispense  Refill  . ibuprofen (ADVIL,MOTRIN) 800 MG tablet   Oral   Take 1 tablet (800 mg total) by mouth 3 (three) times daily.   30 tablet   0   . omeprazole (PRILOSEC) 20 MG capsule   Oral   Take 1 capsule (20 mg total) by mouth daily.   30 capsule   1   . SUMAtriptan (IMITREX) 50 MG tablet   Oral   Take 1 tablet (50 mg total) by mouth every 2 (two) hours as needed for migraine.   10 tablet   0     BP 136/81  Pulse 81  Temp(Src) 98.8 F (37.1 C) (Oral)  SpO2 98%  LMP 06/09/2012  Physical  Exam  Nursing note and vitals reviewed. Constitutional: She is oriented to person, place, and time. She appears well-developed and well-nourished.  Musculoskeletal: She exhibits tenderness.       Hands: Neurological: She is alert and oriented to person, place, and time.  Skin: Skin is warm and dry.    ED Course  Procedures (including critical care time)  Labs Reviewed - No data to display Dg Finger Thumb Right  06/23/2012  *RADIOLOGY REPORT*  Clinical Data: Right-sided pain.  RIGHT THUMB 2+V  Comparison: Right hand radiograph 07/13/2005.  Findings: Three views of the right thumb demonstrate no acute displaced fracture, subluxation, dislocation, joint or soft tissue abnormality.  IMPRESSION: No acute radiographic abnormality of the right thumb.   Original Report Authenticated By: Trudie Reed, M.D.      1. Sprain, thumb, right, initial encounter       MDM          Linna Hoff, MD 06/23/12 1754

## 2012-10-30 ENCOUNTER — Encounter: Payer: Self-pay | Admitting: Internal Medicine

## 2012-10-30 ENCOUNTER — Ambulatory Visit (INDEPENDENT_AMBULATORY_CARE_PROVIDER_SITE_OTHER): Payer: 59 | Admitting: Internal Medicine

## 2012-10-30 VITALS — BP 124/80 | HR 80 | Temp 98.0°F | Wt 122.0 lb

## 2012-10-30 DIAGNOSIS — R42 Dizziness and giddiness: Secondary | ICD-10-CM | POA: Insufficient documentation

## 2012-10-30 DIAGNOSIS — R030 Elevated blood-pressure reading, without diagnosis of hypertension: Secondary | ICD-10-CM

## 2012-10-30 DIAGNOSIS — F17200 Nicotine dependence, unspecified, uncomplicated: Secondary | ICD-10-CM

## 2012-10-30 LAB — HEPATIC FUNCTION PANEL
ALT: 12 U/L (ref 0–35)
Bilirubin, Direct: 0 mg/dL (ref 0.0–0.3)

## 2012-10-30 LAB — CBC WITH DIFFERENTIAL/PLATELET
Basophils Relative: 0.6 % (ref 0.0–3.0)
Eosinophils Relative: 2.2 % (ref 0.0–5.0)
HCT: 42.9 % (ref 36.0–46.0)
Hemoglobin: 14.2 g/dL (ref 12.0–15.0)
MCHC: 33.1 g/dL (ref 30.0–36.0)
MCV: 97.7 fl (ref 78.0–100.0)
Monocytes Relative: 6.4 % (ref 3.0–12.0)
Neutro Abs: 5.8 10*3/uL (ref 1.4–7.7)
Platelets: 322 10*3/uL (ref 150.0–400.0)
RBC: 4.4 Mil/uL (ref 3.87–5.11)
RDW: 12.8 % (ref 11.5–14.6)

## 2012-10-30 LAB — BASIC METABOLIC PANEL
Calcium: 9.3 mg/dL (ref 8.4–10.5)
Chloride: 102 mEq/L (ref 96–112)
GFR: 89.43 mL/min (ref >60.00)

## 2012-10-30 LAB — T4, FREE: Free T4: 1.01 ng/dL (ref 0.60–1.60)

## 2012-10-30 LAB — POCT URINALYSIS DIPSTICK
Bilirubin, UA: NEGATIVE
Blood, UA: NEGATIVE
Glucose, UA: NEGATIVE
Protein, UA: NEGATIVE
Spec Grav, UA: 1.025

## 2012-10-30 NOTE — Patient Instructions (Signed)
Your exam is normal  Blood pressure repeat after rest is normal 120/80  Range . This could be a viral illness or  Certainly stress.  Will notify you  of labs when available. To make sure no other problems underlying this. temporoarily  Take it easy  .  If anxiety  persistent or progressive see your counselor .     Tobacco is  Harmful to your health and can increase blood pressure temporarily.    Check blood pressure readings about 3 x per week and fu if elevated  Consistently.       DASH Diet The DASH diet stands for "Dietary Approaches to Stop Hypertension." It is a healthy eating plan that has been shown to reduce high blood pressure (hypertension) in as little as 14 days, while also possibly providing other significant health benefits. These other health benefits include reducing the risk of breast cancer after menopause and reducing the risk of type 2 diabetes, heart disease, colon cancer, and stroke. Health benefits also include weight loss and slowing kidney failure in patients with chronic kidney disease.  DIET GUIDELINES  Limit salt (sodium). Your diet should contain less than 1500 mg of sodium daily.  Limit refined or processed carbohydrates. Your diet should include mostly whole grains. Desserts and added sugars should be used sparingly.  Include small amounts of heart-healthy fats. These types of fats include nuts, oils, and tub margarine. Limit saturated and trans fats. These fats have been shown to be harmful in the body. CHOOSING FOODS  The following food groups are based on a 2000 calorie diet. See your Registered Dietitian for individual calorie needs. Grains and Grain Products (6 to 8 servings daily)  Eat More Often: Whole-wheat bread, brown rice, whole-grain or wheat pasta, quinoa, popcorn without added fat or salt (air popped).  Eat Less Often: White bread, white pasta, white rice, cornbread. Vegetables (4 to 5 servings daily)  Eat More Often: Fresh, frozen, and  canned vegetables. Vegetables may be raw, steamed, roasted, or grilled with a minimal amount of fat.  Eat Less Often/Avoid: Creamed or fried vegetables. Vegetables in a cheese sauce. Fruit (4 to 5 servings daily)  Eat More Often: All fresh, canned (in natural juice), or frozen fruits. Dried fruits without added sugar. One hundred percent fruit juice ( cup [237 mL] daily).  Eat Less Often: Dried fruits with added sugar. Canned fruit in light or heavy syrup. [employer], Fish, and Poultry (2 servings or less daily. One serving is 3 to 4 oz [85-114 g]).  Eat More Often: Ninety percent or leaner ground beef, tenderloin, sirloin. Round cuts of beef, chicken breast, [place] breast. All fish. Grill, bake, or broil your meat. Nothing should be fried.  Eat Less Often/Avoid: Fatty cuts of meat, [place], or chicken leg, thigh, or wing. Fried cuts of meat or fish. Dairy (2 to 3 servings)  Eat More Often: Low-fat or fat-free milk, low-fat plain or light yogurt, reduced-fat or part-skim cheese.  Eat Less Often/Avoid: Milk (whole, 2%).Whole milk yogurt. Full-fat cheeses. Nuts, Seeds, and Legumes (4 to 5 servings per week)  Eat More Often: All without added salt.  Eat Less Often/Avoid: Salted nuts and seeds, canned beans with added salt. Fats and Sweets (limited)  Eat More Often: Vegetable oils, tub margarines without trans fats, sugar-free gelatin. Mayonnaise and salad dressings.  Eat Less Often/Avoid: Coconut oils, palm oils, butter, stick margarine, cream, half and half, cookies, candy, pie. FOR MORE INFORMATION The Dash Diet Eating Plan: www.dashdiet.org Document  Released: 04/07/2011 Document Revised: 07/11/2011 Document Reviewed: 04/07/2011 [facility] Patient Information 2014 [place], [place]. How to Take Your Blood Pressure  These instructions are only for electronic home blood pressure machines. You will need:   An automatic or semi-automatic blood pressure machine.  Fresh batteries for the  blood pressure machine. HOW DO I USE THESE TOOLS TO CHECK MY BLOOD PRESSURE?   There are 2 numbers that make up your blood pressure. For example: 120/80.  The first number (120 in our example) is called the "systolic pressure." It is a measure of the pressure in your blood vessels when your heart is pumping blood.  The second number (80 in our example) is called the "diastolic pressure." It is a measure of the pressure in your blood vessels when your heart is resting between beats.  Before you buy a home blood pressure machine, check the size of your arm so you can buy the right size cuff. Here is how to check the size of your arm:  Use a tape measure that shows both inches and centimeters.  Wrap the tape measure around the middle upper part of your arm. You may need someone to help you measure right.  Write down your arm measurement in both inches and centimeters.  To measure your blood pressure right, it is important to have the right size cuff.  If your arm is up to 13 inches (37 to 34 centimeters), get an adult cuff size.  If your arm is 13 to 17 inches (35 to 44 centimeters), get a large adult cuff size.  If your arm is 17 to 20 inches (45 to 52 centimeters), get an adult thigh cuff.  Try to rest or relax for at least 30 minutes before you check your blood pressure.  Do not smoke.  Do not have any drinks with caffeine, such as:  Pop.  Coffee.  Tea.  Check your blood pressure in a quiet room.  Sit down and stretch out your arm on a table. Keep your arm at about the level of your heart. Let your arm relax. GETTING BLOOD PRESSURE READINGS  Make sure you remove any tight-fighting clothing from your arm. Wrap the cuff around your upper arm. Wrap it just above the bend, and above where you felt the pulse. You should be able to slip a finger between the cuff and your arm. If you cannot slip a finger in the cuff, it is too tight and should be removed and rewrapped.  Some  units requires you to manually pump up the arm cuff.  Automatic units inflate the cuff when you press a button.  Cuff deflation is automatic in both models.  After the cuff is inflated, the unit measures your blood pressure and pulse. The readings are displayed on a monitor. Hold still and breathe normally while the cuff is inflated.  Getting a reading takes less than a minute.  Some models store readings in a memory. Some provide a printout of readings.  Get readings at different times of the day. You should wait at least 5 minutes between readings. Take readings with you to your next doctor's visit. Document Released: 03/31/2008 Document Revised: 07/11/2011 Document Reviewed: 03/31/2008 [facility] Patient Information 2014 [place], [place].

## 2012-10-30 NOTE — Progress Notes (Signed)
Chief Complaint  Patient presents with  . Hypertension    Was 151/111 on Saturday and 147/108 on Friday.  Could be due to stress and anxiety.  Dad and maternal grandfather have high bp.    HPI:  Patient comes in today  for  new problem evaluation. Last visit over a year ago. Dizzy and weak feeling  For a  Week and had to go home from work then. [employer] well. ? Could be stress . Checked BP  (Roommate  Is a Theatre stage manager ).   Readings   Last week and weekend .  147/98 and elevated  No meds  otc   No supplements. Ibuprofen last  Week x 1  .  ROS: See pertinent positives and negatives per HPI. Soreness left breast no mass periods normal  Walks  Treadmill  sometimes New apt   for 10 days.  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 Causes of stressed mostly financial. father high blood pressure .  Half a pack per day; only social alcohol occasionally ;negative RD  ;neg Caffiene.   Past Medical History  Diagnosis Date  . Shoulder injury   . Ankle injuries   . Headache(784.0)   . Encounter for drug rehabilitation 2010    opiate    Family History  Problem Relation Age of Onset  . Diabetes    . Alcohol abuse    . Drug abuse    . Other      cv    History   Social History  . Marital Status: Single    Spouse Name: N/A    Number of Children: N/A  . Years of Education: N/A   Social History Main Topics  . Smoking status: Current Every Day Smoker -- 0.50 packs/day  . Smokeless tobacco: None  . Alcohol Use: Yes  . Drug Use: No  . Sexually Active: None   Other Topics Concern  . None   Social History Narrative   Caffeine   HH of 2 no pets   Living at home   [facility] training   Working at [employer]    Outpatient Encounter Prescriptions as of 10/30/2012  Medication Sig Dispense Refill  . omeprazole (PRILOSEC) 20 MG capsule Take 1 capsule (20 mg total) by mouth daily.  30 capsule  1  . [DISCONTINUED] ibuprofen (ADVIL,MOTRIN) 800 MG tablet  Take 1 tablet (800 mg total) by mouth 3 (three) times daily.  30 tablet  0   No facility-administered encounter medications on file as of 10/30/2012.    EXAM:  BP 124/80  Pulse 80  Temp(Src) 98 F (36.7 C) (Oral)  Wt 122 lb (55.339 kg)  BMI 19.7 kg/m2  SpO2 98%  LMP 10/20/2012  Body mass index is 19.7 kg/(m^2).  GENERAL: vitals reviewed and listed above, alert, oriented, appears well hydrated and in no acute distress HEENT: atraumatic, conjunctiva  clear, no obvious abnormalities on inspection of external nose and ears  tms nl eoms nl OP : no lesion edema or exudate  NECK: no obvious masses on inspection palpation  No adenopathy ? Palp thyroid  LUNGS: clear to auscultation bilaterally, no wheezes, rales or rhonchi, good air movement left breast no obv nodules  Points to lateral area  Chest as sore  BP left 116/82 standing  Pulse 74  sitt 120/80  CV: HRRR, no clubbing cyanosis or  peripheral edema nl cap refill  Neuro non focal no tremor  Nl gait  MS: moves all extremities without noticeable focal  abnormality  PSYCH: pleasant and cooperative, no obvious depression  mildy stressed  Lab Results  Component Value Date   WBC 6.1 04/01/2011   HGB 15.0 04/01/2011   HCT 44.0 04/01/2011   PLT 320.0 04/01/2011   GLUCOSE 93 04/01/2011   CHOL 148 06/21/2006   TRIG 64 06/21/2006   HDL 45.4 06/21/2006   LDLDIRECT 89.8 06/21/2006   LDLCALC 90 06/21/2006   ALT 12 04/01/2011   AST 19 04/01/2011   NA 139 04/01/2011   K 4.4 04/01/2011   CL 104 04/01/2011   CREATININE 1.0 04/01/2011   BUN 10 04/01/2011   CO2 27 04/01/2011   TSH 1.19 04/01/2011    ASSESSMENT AND PLAN:  Discussed the following assessment and plan:  Elevated blood pressure reading - Plan: Basic metabolic panel, CBC with Differential, Hepatic function panel, TSH, T4, free, POCT urinalysis dipstick  Dizziness and giddiness - Plan: Basic metabolic panel, CBC with Differential, Hepatic function panel, TSH, T4, free, POCT  urinalysis dipstick  -Patient advised to return or notify health care team  if symptoms worsen or persist or new concerns arise.  Patient Instructions  Your exam is normal  Blood pressure repeat after rest is normal 120/80  Range . This could be a viral illness or  Certainly stress.  Will notify you  of labs when available. To make sure no other problems underlying this. temporoarily  Take it easy  .  If anxiety  persistent or progressive see your counselor .     Tobacco is  Harmful to your health and can increase blood pressure temporarily.    Check blood pressure readings about 3 x per week and fu if elevated  Consistently.       DASH Diet The DASH diet stands for "Dietary Approaches to Stop Hypertension." It is a healthy eating plan that has been shown to reduce high blood pressure (hypertension) in as little as 14 days, while also possibly providing other significant health benefits. These other health benefits include reducing the risk of breast cancer after menopause and reducing the risk of type 2 diabetes, heart disease, colon cancer, and stroke. Health benefits also include weight loss and slowing kidney failure in patients with chronic kidney disease.  DIET GUIDELINES  Limit salt (sodium). Your diet should contain less than 1500 mg of sodium daily.  Limit refined or processed carbohydrates. Your diet should include mostly whole grains. Desserts and added sugars should be used sparingly.  Include small amounts of heart-healthy fats. These types of fats include nuts, oils, and tub margarine. Limit saturated and trans fats. These fats have been shown to be harmful in the body. CHOOSING FOODS  The following food groups are based on a 2000 calorie diet. See your Registered Dietitian for individual calorie needs. Grains and Grain Products (6 to 8 servings daily)  Eat More Often: Whole-wheat bread, brown rice, whole-grain or wheat pasta, quinoa, popcorn without added fat or salt  (air popped).  Eat Less Often: White bread, white pasta, white rice, cornbread. Vegetables (4 to 5 servings daily)  Eat More Often: Fresh, frozen, and canned vegetables. Vegetables may be raw, steamed, roasted, or grilled with a minimal amount of fat.  Eat Less Often/Avoid: Creamed or fried vegetables. Vegetables in a cheese sauce. Fruit (4 to 5 servings daily)  Eat More Often: All fresh, canned (in natural juice), or frozen fruits. Dried fruits without added sugar. One hundred percent fruit juice (  cup [id number] mL] daily).  Eat Less Often: Dried fruits with added sugar. Canned fruit in light or heavy syrup. [employer], Fish, and Poultry (2 servings or less daily. One serving is 3 to 4 oz [85-114 g]).  Eat More Often: Ninety percent or leaner ground beef, tenderloin, sirloin. Round cuts of beef, chicken breast, [place] breast. All fish. Grill, bake, or broil your meat. Nothing should be fried.  Eat Less Often/Avoid: Fatty cuts of meat, [place], or chicken leg, thigh, or wing. Fried cuts of meat or fish. Dairy (2 to 3 servings)  Eat More Often: Low-fat or fat-free milk, low-fat plain or light yogurt, reduced-fat or part-skim cheese.  Eat Less Often/Avoid: Milk (whole, 2%).Whole milk yogurt. Full-fat cheeses. Nuts, Seeds, and Legumes (4 to 5 servings per week)  Eat More Often: All without added salt.  Eat Less Often/Avoid: Salted nuts and seeds, canned beans with added salt. Fats and Sweets (limited)  Eat More Often: Vegetable oils, tub margarines without trans fats, sugar-free gelatin. Mayonnaise and salad dressings.  Eat Less Often/Avoid: Coconut oils, palm oils, butter, stick margarine, cream, half and half, cookies, candy, pie. FOR MORE INFORMATION The Dash Diet Eating Plan: www.dashdiet.org Document Released: 04/07/2011 Document Revised: 07/11/2011 Document Reviewed: 04/07/2011 [facility] Patient Information 2014 [place], [place]. How to Take Your Blood Pressure  These instructions  are only for electronic home blood pressure machines. You will need:   An automatic or semi-automatic blood pressure machine.  Fresh batteries for the blood pressure machine. HOW DO I USE THESE TOOLS TO CHECK MY BLOOD PRESSURE?   There are 2 numbers that make up your blood pressure. For example: 120/80.  The first number (120 in our example) is called the "systolic pressure." It is a measure of the pressure in your blood vessels when your heart is pumping blood.  The second number (80 in our example) is called the "diastolic pressure." It is a measure of the pressure in your blood vessels when your heart is resting between beats.  Before you buy a home blood pressure machine, check the size of your arm so you can buy the right size cuff. Here is how to check the size of your arm:  Use a tape measure that shows both inches and centimeters.  Wrap the tape measure around the middle upper part of your arm. You may need someone to help you measure right.  Write down your arm measurement in both inches and centimeters.  To measure your blood pressure right, it is important to have the right size cuff.  If your arm is up to 13 inches (37 to 34 centimeters), get an adult cuff size.  If your arm is 13 to 17 inches (35 to 44 centimeters), get a large adult cuff size.  If your arm is 17 to 20 inches (45 to 52 centimeters), get an adult thigh cuff.  Try to rest or relax for at least 30 minutes before you check your blood pressure.  Do not smoke.  Do not have any drinks with caffeine, such as:  Pop.  Coffee.  Tea.  Check your blood pressure in a quiet room.  Sit down and stretch out your arm on a table. Keep your arm at about the level of your heart. Let your arm relax. GETTING BLOOD PRESSURE READINGS  Make sure you remove any tight-fighting clothing from your arm. Wrap the cuff around your upper arm. Wrap it just above the bend, and above where you felt the pulse. You should  be able  to slip a finger between the cuff and your arm. If you cannot slip a finger in the cuff, it is too tight and should be removed and rewrapped.  Some units requires you to manually pump up the arm cuff.  Automatic units inflate the cuff when you press a button.  Cuff deflation is automatic in both models.  After the cuff is inflated, the unit measures your blood pressure and pulse. The readings are displayed on a monitor. Hold still and breathe normally while the cuff is inflated.  Getting a reading takes less than a minute.  Some models store readings in a memory. Some provide a printout of readings.  Get readings at different times of the day. You should wait at least 5 minutes between readings. Take readings with you to your next doctor's visit. Document Released: 03/31/2008 Document Revised: 07/11/2011 Document Reviewed: 03/31/2008 [facility] Patient Information 2014 [place], [place].    Neta Mends. Panosh M.D.

## 2012-10-31 ENCOUNTER — Encounter: Payer: Self-pay | Admitting: Family Medicine

## 2012-12-03 ENCOUNTER — Emergency Department (HOSPITAL_COMMUNITY): Payer: 59

## 2012-12-03 ENCOUNTER — Emergency Department (HOSPITAL_COMMUNITY)
Admission: EM | Admit: 2012-12-03 | Discharge: 2012-12-03 | Disposition: A | Discharge status: Home / Self Care | Payer: 59 | Attending: Emergency Medicine | Admitting: Emergency Medicine

## 2012-12-03 ENCOUNTER — Encounter (HOSPITAL_COMMUNITY): Payer: Self-pay | Admitting: *Deleted

## 2012-12-03 DIAGNOSIS — W2209XA Striking against other stationary object, initial encounter: Secondary | ICD-10-CM | POA: Insufficient documentation

## 2012-12-03 DIAGNOSIS — R0602 Shortness of breath: Secondary | ICD-10-CM | POA: Insufficient documentation

## 2012-12-03 DIAGNOSIS — F17200 Nicotine dependence, unspecified, uncomplicated: Secondary | ICD-10-CM | POA: Insufficient documentation

## 2012-12-03 DIAGNOSIS — Z87828 Personal history of other (healed) physical injury and trauma: Secondary | ICD-10-CM | POA: Insufficient documentation

## 2012-12-03 DIAGNOSIS — S0003XA Contusion of scalp, initial encounter: Secondary | ICD-10-CM | POA: Insufficient documentation

## 2012-12-03 DIAGNOSIS — Y939 Activity, unspecified: Secondary | ICD-10-CM | POA: Insufficient documentation

## 2012-12-03 DIAGNOSIS — IMO0002 Reserved for concepts with insufficient information to code with codable children: Secondary | ICD-10-CM | POA: Insufficient documentation

## 2012-12-03 DIAGNOSIS — I10 Essential (primary) hypertension: Secondary | ICD-10-CM | POA: Insufficient documentation

## 2012-12-03 DIAGNOSIS — S1093XA Contusion of unspecified part of neck, initial encounter: Secondary | ICD-10-CM | POA: Insufficient documentation

## 2012-12-03 DIAGNOSIS — R1310 Dysphagia, unspecified: Secondary | ICD-10-CM | POA: Insufficient documentation

## 2012-12-03 DIAGNOSIS — Y929 Unspecified place or not applicable: Secondary | ICD-10-CM | POA: Insufficient documentation

## 2012-12-03 HISTORY — DX: Essential (primary) hypertension: I10

## 2012-12-03 LAB — CBC WITH DIFFERENTIAL/PLATELET
Basophils Absolute: 0.1 10*3/uL (ref 0.0–0.1)
Basophils Relative: 0 % (ref 0–1)
Hemoglobin: 16.1 g/dL — ABNORMAL HIGH (ref 12.0–15.0)
MCHC: 36.1 g/dL — ABNORMAL HIGH (ref 30.0–36.0)
Neutro Abs: 12.7 10*3/uL — ABNORMAL HIGH (ref 1.7–7.7)
Neutrophils Relative %: 80 % — ABNORMAL HIGH (ref 43–77)
RDW: 11.9 % (ref 11.5–15.5)

## 2012-12-03 LAB — POCT I-STAT, CHEM 8
BUN: 7 mg/dL (ref 6–23)
Calcium, Ion: 1.16 mmol/L (ref 1.12–1.23)
Creatinine, Ser: 0.8 mg/dL (ref 0.50–1.10)
Glucose, Bld: 98 mg/dL (ref 70–99)
Hemoglobin: 17 g/dL — ABNORMAL HIGH (ref 12.0–15.0)
Potassium: 3.7 mEq/L (ref 3.5–5.1)
TCO2: 24 mmol/L (ref 0–100)

## 2012-12-03 MED ORDER — ONDANSETRON HCL 4 MG/2ML IJ SOLN
4.0000 mg | Freq: Once | INTRAMUSCULAR | Status: AC
  Administered 2012-12-03: 4 mg via INTRAVENOUS
  Filled 2012-12-03: qty 2

## 2012-12-03 MED ORDER — IBUPROFEN 100 MG/5ML PO SUSP
800.0000 mg | Freq: Once | ORAL | Status: DC
  Filled 2012-12-03: qty 40

## 2012-12-03 MED ORDER — MORPHINE SULFATE 4 MG/ML IJ SOLN
4.0000 mg | Freq: Once | INTRAMUSCULAR | Status: AC
  Administered 2012-12-03: 4 mg via INTRAVENOUS
  Filled 2012-12-03: qty 1

## 2012-12-03 MED ORDER — IOHEXOL 300 MG/ML  SOLN
80.0000 mL | Freq: Once | INTRAMUSCULAR | Status: AC | PRN
  Administered 2012-12-03: 80 mL via INTRAVENOUS
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
## 2012-12-03 NOTE — ED Notes (Signed)
Patient transported to CT 

## 2012-12-03 NOTE — ED Notes (Signed)
Patient transported to X-ray 
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
## 2012-12-07 NOTE — ED Provider Notes (Signed)
Medical screening examination/treatment/procedure(s) were performed by non-physician practitioner and as supervising physician I was immediately available for consultation/collaboration.   Kim Lennox H Adi Seales, MD 12/07/12 1502 

## 2012-12-26 ENCOUNTER — Other Ambulatory Visit (HOSPITAL_COMMUNITY)
Admission: RE | Admit: 2012-12-26 | Discharge: 2012-12-26 | Disposition: A | Discharge status: Home / Self Care | Payer: 59 | Source: Ambulatory Visit | Attending: Internal Medicine | Admitting: Internal Medicine

## 2012-12-26 ENCOUNTER — Ambulatory Visit (INDEPENDENT_AMBULATORY_CARE_PROVIDER_SITE_OTHER): Payer: 59 | Admitting: Internal Medicine

## 2012-12-26 ENCOUNTER — Encounter: Payer: Self-pay | Admitting: Internal Medicine

## 2012-12-26 VITALS — BP 130/90 | HR 86 | Temp 98.0°F | Wt 119.0 lb

## 2012-12-26 DIAGNOSIS — N72 Inflammatory disease of cervix uteri: Secondary | ICD-10-CM

## 2012-12-26 DIAGNOSIS — Z113 Encounter for screening for infections with a predominantly sexual mode of transmission: Secondary | ICD-10-CM | POA: Insufficient documentation

## 2012-12-26 DIAGNOSIS — Z01419 Encounter for gynecological examination (general) (routine) without abnormal findings: Secondary | ICD-10-CM

## 2012-12-26 DIAGNOSIS — N898 Other specified noninflammatory disorders of vagina: Secondary | ICD-10-CM

## 2012-12-26 DIAGNOSIS — R300 Dysuria: Secondary | ICD-10-CM

## 2012-12-26 DIAGNOSIS — N765 Ulceration of vagina: Secondary | ICD-10-CM | POA: Insufficient documentation

## 2012-12-26 DIAGNOSIS — N949 Unspecified condition associated with female genital organs and menstrual cycle: Secondary | ICD-10-CM

## 2012-12-26 DIAGNOSIS — N760 Acute vaginitis: Secondary | ICD-10-CM | POA: Insufficient documentation

## 2012-12-26 LAB — POCT URINALYSIS DIPSTICK
Ketones, UA: NEGATIVE
Spec Grav, UA: >=1.03
Urobilinogen, UA: 0.2
pH, UA: 6

## 2012-12-26 MED ORDER — VALACYCLOVIR HCL 1 G PO TABS: 1000.0000 mg | ORAL_TABLET | Freq: Two times a day (BID) | ORAL | Status: DC

## 2012-12-26 NOTE — Patient Instructions (Signed)
Uncertain cause  We will treat you for herpes simplex a virus can cause  Sores in genital area or in mouth . and wait for the tests to come back. This infection  Can recur

## 2012-12-26 NOTE — Progress Notes (Signed)
Chief Complaint  Patient presents with  . Dysuria    Started 4-5 days ago.  . Vaginal odor  . Vaginal Discharge    HPI: Patient comes in for an acute same day visit. She has had 3-5 days of perivaginal discomfort burning like a sore with no significant drainage getting a bit worse No fever  3-4 days .   Used compresses  No hx of same.   Noantibiotics. No significant vaginal discharge is never had a Pap no recent exposures  No history of cold sores ROS: See pertinent positives and negatives per HPI. No chest pain shortness of breath dysuria UTI abdominal pain  Past Medical History  Diagnosis Date  . Shoulder injury   . Ankle injuries   . Headache(784.0)   . 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 Hypertension     Family History  Problem Relation Age of Onset  . Diabetes    . Alcohol abuse    . Drug abuse    . Other      cv    History   Social History  . Marital Status: Single    Spouse Name: N/A    Number of Children: N/A  . Years of Education: N/A   Social History Main Topics  . Smoking status: Current Every Day Smoker -- 0.50 packs/day  . Smokeless tobacco: None  . Alcohol Use: Yes  . Drug Use: No  . Sexual Activity: None   Other Topics Concern  . None   Social History Narrative   Caffeine   HH of 2 no pets   Living at home   [employer] [facility] training   Working at [employer]    Outpatient Encounter Prescriptions as of 12/26/2012  Medication Sig Dispense Refill  . Ibuprofen (IBU PO) Take 2 tablets by mouth once.      . valACYclovir (VALTREX) 1000 MG tablet Take 1 tablet (1,000 mg total) by mouth 2 (two) times daily. For 10 days then daily  40 tablet  2   No facility-administered encounter medications on file as of 12/26/2012.    EXAM:  BP 130/90  Pulse 86  Temp(Src) 98 F (36.7 C) (Oral)  Wt 119 lb (53.978 kg)  BMI 19.22 kg/m2  SpO2 98%  LMP 11/18/2012  Body mass index is 19.22 kg/(m^2).  GENERAL: vitals reviewed and  listed above, alert, oriented, appears well hydrated and in no acute distress  Abdomen soft without hepatomegaly guarding or rebound no inguinal adenopathy External GU: Discrete pink red bumps throughout labia at the posterior fourchette there is a crop of 2 shallow tiny ulcers that are tender was flat base. Speculum exam performed cervix os clear but white yellow discharge noted no obvious ulcer. Pap done as well as GC chlamydia yeast trichomoniasis BV.   Culture swab HSV done from vaginal ulcer.  ASSESSMENT AND PLAN:  Discussed the following assessment and plan:  Vaginal ulceration - Plan: PAP [[place]], Wound culture, CANCELED: Herpes simplex virus culture  Dysuria - Plan: POC Urinalysis Dipstick, CANCELED: Herpes simplex virus culture  Vaginal odor - Plan: PAP [[place]], CANCELED: Herpes simplex virus culture  Encounter for routine gynecological examination - Plan: PAP [[place]] Discussed intralesional diagnosis based on exam -Patient advised to return or notify health care team  if symptoms worsen or persist or new concerns arise.  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 and wait for the tests to come back. This infection  Can recur         Neta Mends. Jeremyah Jelley M.D.

## 2012-12-29 LAB — WOUND CULTURE
Gram Stain: NONE SEEN
Organism ID, Bacteria: NORMAL

## 2013-01-01 ENCOUNTER — Telehealth: Payer: Self-pay | Admitting: Internal Medicine

## 2013-01-01 NOTE — Telephone Encounter (Signed)
Patient Information:  Caller Name: Ayse  Phone: [phone]  Patient: Chanley, Mcenery  Gender: Female  DOB: November 25, 1990  Age: 22 Years  PCP: Berniece Andreas [facility])  Pregnant: No  Office Follow Up:  Does the office need to follow up with this patient?: No  Instructions For The Office: N/A   Symptoms  Reason For Call & Symptoms: Pt calling for results of would culture done 12/26/12 and culture is negative.  She is requesting another appt. as she has a "spot" that really burns.  Taking Ibuprofen "but not really helping".  Reviewed Health History In EMR: Yes  Reviewed Medications In EMR: Yes  Reviewed Allergies In EMR: Yes  Reviewed Surgeries / Procedures: N/A  Date of Onset of Symptoms: 12/23/2012  Treatments Tried: Valtrex  Treatments Tried Worked: No OB / GYN:  LMP: 01/01/2013  Guideline(s) Used:  Vaginal Discharge  Disposition Per Guideline:   See Today in Office.  No appts today and pt states being seen 01/02/13 would be better for her.   Reason For Disposition Reached:   Patient wants to be seen  Advice Given:  Genital Hygiene:   Keep your genital area clean. Wash daily.  Keep your genital area dry. Wear cotton underwear or underwear with a cotton crotch.  Do not douche.  Do not use feminine hygiene products.  Patient Will Follow Care Advice:  YES  Appointment Scheduled:  01/02/2013 15:00:00 Appointment Scheduled Provider:  Berniece Andreas [facility])

## 2013-01-02 ENCOUNTER — Encounter: Payer: Self-pay | Admitting: Internal Medicine

## 2013-01-02 ENCOUNTER — Ambulatory Visit (INDEPENDENT_AMBULATORY_CARE_PROVIDER_SITE_OTHER): Payer: 59 | Admitting: Internal Medicine

## 2013-01-02 VITALS — BP 130/94 | Temp 98.2°F | Wt 119.0 lb

## 2013-01-02 DIAGNOSIS — N765 Ulceration of vagina: Secondary | ICD-10-CM

## 2013-01-02 DIAGNOSIS — N72 Inflammatory disease of cervix uteri: Secondary | ICD-10-CM

## 2013-01-02 DIAGNOSIS — A499 Bacterial infection, unspecified: Secondary | ICD-10-CM

## 2013-01-02 DIAGNOSIS — N760 Acute vaginitis: Secondary | ICD-10-CM

## 2013-01-02 DIAGNOSIS — B9689 Other specified bacterial agents as the cause of diseases classified elsewhere: Secondary | ICD-10-CM | POA: Insufficient documentation

## 2013-01-02 DIAGNOSIS — Z113 Encounter for screening for infections with a predominantly sexual mode of transmission: Secondary | ICD-10-CM

## 2013-01-02 LAB — CBC WITH DIFFERENTIAL/PLATELET
Basophils Absolute: 0 10*3/uL (ref 0.0–0.1)
Basophils Relative: 0.3 % (ref 0.0–3.0)
Eosinophils Absolute: 0.1 10*3/uL (ref 0.0–0.7)
MCHC: 33.8 g/dL (ref 30.0–36.0)
MCV: 94 fl (ref 78.0–100.0)
Monocytes Absolute: 0.4 10*3/uL (ref 0.1–1.0)
Neutro Abs: 8.2 10*3/uL — ABNORMAL HIGH (ref 1.4–7.7)
Neutrophils Relative %: 76.7 % (ref 43.0–77.0)
RBC: 4.76 Mil/uL (ref 3.87–5.11)
RDW: 12 % (ref 11.5–14.6)

## 2013-01-02 MED ORDER — METRONIDAZOLE 500 MG PO TABS: 500.0000 mg | ORAL_TABLET | Freq: Two times a day (BID) | ORAL | Status: DC

## 2013-01-02 NOTE — Progress Notes (Signed)
Chief Complaint  Patient presents with  . Follow-up    HPI: Here with companion today followup for painful vaginal ulcer see last note.. No new sx  Still sore area  . No fever ? If Valtrex makes her drowsy. Has some vaginal discharge but not much No abd p[ain . ROS: See pertinent positives and negatives per HPI. 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 Shoulder injury   . Ankle injuries   . Headache(784.0)   . 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 Hypertension     Family History  Problem Relation Age of Onset  . Diabetes    . Alcohol abuse    . Drug abuse    . Other      cv    History   Social History  . Marital Status: Single    Spouse Name: N/A    Number of Children: N/A  . Years of Education: N/A   Social History Main Topics  . Smoking status: Current Every Day Smoker -- 0.50 packs/day  . Smokeless tobacco: None  . Alcohol Use: Yes  . Drug Use: No  . Sexual Activity: None   Other Topics Concern  . None   Social History Narrative   Caffeine   HH of 2 no pets   Living at home   [facility] training   Working at [employer]    Outpatient Encounter Prescriptions as of 01/02/2013  Medication Sig Dispense Refill  . Ibuprofen (IBU PO) Take 2 tablets by mouth once.      . valACYclovir (VALTREX) 1000 MG tablet Take 1 tablet (1,000 mg total) by mouth 2 (two) times daily. For 10 days then daily  40 tablet  2  . metroNIDAZOLE (FLAGYL) 500 MG tablet Take 1 tablet (500 mg total) by mouth 2 (two) times daily.  14 tablet  0   No facility-administered encounter medications on file as of 01/02/2013.    EXAM:  BP 130/94  Temp(Src) 98.2 F (36.8 C) (Oral)  Wt 119 lb (53.978 kg)  BMI 19.22 kg/m2  LMP 11/18/2012  Body mass index is 19.22 kg/(m^2).  GENERAL: vitals reviewed and listed above, alert, oriented, appears well hydrated and in no acute distress Ext gu  fadings superficial ulcer at posterior fourchette  Healing . Inguinal area shoddy poss tender adenopathy  Has tampon in .  MS: moves all extremities without noticeable focal  abnormality PSYCH: pleasant and cooperative, no obvious depression or anxiety  ASSESSMENT AND PLAN:  Discussed the following assessment and plan:  Vaginal ulceration - improved at post fourchette  - Plan: HIV antibody, RPR, HSV(herpes smplx)abs-1+2(IgG+IgM)-bld, CBC with Differential, Hepatitis C antibody, metroNIDAZOLE (FLAGYL) 500 MG tablet  Bacterial vaginosis - by lab  no impressive on exam - Plan: HIV antibody, RPR, HSV(herpes smplx)abs-1+2(IgG+IgM)-bld, CBC with Differential, Hepatitis C antibody, metroNIDAZOLE (FLAGYL) 500 MG tablet  Screening for STD (sexually transmitted disease) - Plan: HIV antibody, RPR, HSV(herpes smplx)abs-1+2(IgG+IgM)-bld, CBC with Differential, Hepatitis C antibody, metroNIDAZOLE (FLAGYL) 500 MG tablet Labs reviewed neg except hsv not back yet but ( Ulcer test wasn't the right swab for hsv ) No path bacteria .  -Patient advised to return or notify health care team  if symptoms worsen or persist or new concerns arise.  Patient Instructions  Await further testing  At this time  Can add empiric treatment for  BV Decrease the  valtrex to once a day.  Bacterial Vaginosis Bacterial vaginosis (BV) is a vaginal infection where the normal balance of bacteria in the vagina is disrupted. The normal balance is then replaced by an overgrowth of certain bacteria. There are several different kinds of bacteria that can cause BV. BV is the most common vaginal infection in women of childbearing age. CAUSES   The cause of BV is not fully understood. BV develops when there is an increase or imbalance of harmful bacteria.  Some activities or behaviors can upset the normal balance of bacteria in the vagina and put women at increased risk including:  Having a new sex partner or multiple sex  partners.  Douching.  Using an intrauterine device (IUD) for contraception.  It is not clear what role sexual activity plays in the development of BV. However, women that have never had sexual intercourse are rarely infected with BV. Women do not get BV from toilet seats, bedding, swimming pools or from touching objects around them.  SYMPTOMS   Grey vaginal discharge.  A fish-like odor with discharge, especially after sexual intercourse.  Itching or burning of the vagina and vulva.  Burning or pain with urination.  Some women have no signs or symptoms at all. DIAGNOSIS  Your caregiver must examine the vagina for signs of BV. Your caregiver will perform lab tests and look at the sample of vaginal fluid through a microscope. They will look for bacteria and abnormal cells (clue cells), a pH test higher than 4.5, and a positive amine test all associated with BV.  RISKS AND COMPLICATIONS   Pelvic inflammatory disease (PID).  Infections following gynecology surgery.  Developing HIV.  Developing herpes virus. TREATMENT  Sometimes BV will clear up without treatment. However, all women with symptoms of BV should be treated to avoid complications, especially if gynecology surgery is planned. Female partners generally do not need to be treated. However, BV may spread between female sex partners so treatment is helpful in preventing a recurrence of BV.   BV may be treated with antibiotics. The antibiotics come in either pill or vaginal cream forms. Either can be used with nonpregnant or pregnant women, but the recommended dosages differ. These antibiotics are not harmful to the baby.  BV can recur after treatment. If this happens, a second round of antibiotics will often be prescribed.  Treatment is important for pregnant women. If not treated, BV can cause a premature delivery, especially for a pregnant woman who had a premature birth in the past. All pregnant women who have symptoms of BV  should be checked and treated.  For chronic reoccurrence of BV, treatment with a type of prescribed gel vaginally twice a week is helpful. HOME CARE INSTRUCTIONS   Finish all medication as directed by your caregiver.  Do not have sex until treatment is completed.  Tell your sexual partner that you have a vaginal infection. They should see their caregiver and be treated if they have problems, such as a mild rash or itching.  Practice safe sex. Use condoms. Only have 1 sex partner. PREVENTION  Basic prevention steps can help reduce the risk of upsetting the natural balance of bacteria in the vagina and developing BV:  Do not have sexual intercourse (be abstinent).  Do not douche.  Use all of the medicine prescribed for treatment of BV, even if the signs and symptoms go away.  Tell your sex partner if you have BV. That way, they can be treated, if needed, to  prevent reoccurrence. SEEK MEDICAL CARE IF:   Your symptoms are not improving after 3 days of treatment.  You have increased discharge, pain, or fever. MAKE SURE YOU:   Understand these instructions.  Will watch your condition.  Will get help right away if you are not doing well or get worse. FOR MORE INFORMATION  Division of STD Prevention (DSTDP), Centers for Disease Control and Prevention: SolutionApps.[employer].za American Social Health Association (ASHA): www.ashastd.org  Document Released: 04/18/2005 Document Revised: 07/11/2011 Document Reviewed: 10/09/2008 [facility] Patient Information 2014 [place], [place].       Neta Mends. Maritsa Hunsucker M.D.

## 2013-01-02 NOTE — Patient Instructions (Signed)
Await further testing  At this time  Can add empiric treatment for  BV Decrease the valtrex to once a day.  Bacterial Vaginosis Bacterial vaginosis (BV) is a vaginal infection where the normal balance of bacteria in the vagina is disrupted. The normal balance is then replaced by an overgrowth of certain bacteria. There are several different kinds of bacteria that can cause BV. BV is the most common vaginal infection in women of childbearing age. CAUSES   The cause of BV is not fully understood. BV develops when there is an increase or imbalance of harmful bacteria.  Some activities or behaviors can upset the normal balance of bacteria in the vagina and put women at increased risk including:  Having a new sex partner or multiple sex partners.  Douching.  Using an intrauterine device (IUD) for contraception.  It is not clear what role sexual activity plays in the development of BV. However, women that have never had sexual intercourse are rarely infected with BV. Women do not get BV from toilet seats, bedding, swimming pools or from touching objects around them.  SYMPTOMS   Grey vaginal discharge.  A fish-like odor with discharge, especially after sexual intercourse.  Itching or burning of the vagina and vulva.  Burning or pain with urination.  Some women have no signs or symptoms at all. DIAGNOSIS  Your caregiver must examine the vagina for signs of BV. Your caregiver will perform lab tests and look at the sample of vaginal fluid through a microscope. They will look for bacteria and abnormal cells (clue cells), a pH test higher than 4.5, and a positive amine test all associated with BV.  RISKS AND COMPLICATIONS   Pelvic inflammatory disease (PID).  Infections following gynecology surgery.  Developing HIV.  Developing herpes virus. TREATMENT  Sometimes BV will clear up without treatment. However, all women with symptoms of BV should be treated to avoid complications,  especially if gynecology surgery is planned. Female partners generally do not need to be treated. However, BV may spread between female sex partners so treatment is helpful in preventing a recurrence of BV.   BV may be treated with antibiotics. The antibiotics come in either pill or vaginal cream forms. Either can be used with nonpregnant or pregnant women, but the recommended dosages differ. These antibiotics are not harmful to the baby.  BV can recur after treatment. If this happens, a second round of antibiotics will often be prescribed.  Treatment is important for pregnant women. If not treated, BV can cause a premature delivery, especially for a pregnant woman who had a premature birth in the past. All pregnant women who have symptoms of BV should be checked and treated.  For chronic reoccurrence of BV, treatment with a type of prescribed gel vaginally twice a week is helpful. HOME CARE INSTRUCTIONS   Finish all medication as directed by your caregiver.  Do not have sex until treatment is completed.  Tell your sexual partner that you have a vaginal infection. They should see their caregiver and be treated if they have problems, such as a mild rash or itching.  Practice safe sex. Use condoms. Only have 1 sex partner. PREVENTION  Basic prevention steps can help reduce the risk of upsetting the natural balance of bacteria in the vagina and developing BV:  Do not have sexual intercourse (be abstinent).  Do not douche.  Use all of the medicine prescribed for treatment of BV, even if the signs and symptoms go away.  Tell  your sex partner if you have BV. That way, they can be treated, if needed, to prevent reoccurrence. SEEK MEDICAL CARE IF:   Your symptoms are not improving after 3 days of treatment.  You have increased discharge, pain, or fever. MAKE SURE YOU:   Understand these instructions.  Will watch your condition.  Will get help right away if you are not doing well or get  worse. FOR MORE INFORMATION  Division of STD Prevention (DSTDP), Centers for Disease Control and Prevention: SolutionApps.[employer].za American Social Health Association (ASHA): www.ashastd.org  Document Released: 04/18/2005 Document Revised: 07/11/2011 Document Reviewed: 10/09/2008 [facility] Patient Information 2014 [place], [place].

## 2013-01-03 LAB — HIV ANTIBODY (ROUTINE TESTING W REFLEX): HIV: NONREACTIVE

## 2013-01-03 LAB — HSV(HERPES SMPLX)ABS-I+II(IGG+IGM)-BLD
HSV 1 Glycoprotein G Ab, IgG: 0.55 IV
HSV 2 Glycoprotein G Ab, IgG: <0.1 IV

## 2013-03-07 ENCOUNTER — Other Ambulatory Visit: Payer: Self-pay

## 2013-08-11 ENCOUNTER — Emergency Department (HOSPITAL_COMMUNITY): Payer: 59

## 2013-08-11 ENCOUNTER — Emergency Department (HOSPITAL_COMMUNITY)
Admission: EM | Admit: 2013-08-11 | Discharge: 2013-08-11 | Disposition: A | Discharge status: Home / Self Care | Payer: 59 | Attending: Emergency Medicine | Admitting: Emergency Medicine

## 2013-08-11 ENCOUNTER — Encounter (HOSPITAL_COMMUNITY): Payer: Self-pay | Admitting: Emergency Medicine

## 2013-08-11 DIAGNOSIS — F17200 Nicotine dependence, unspecified, uncomplicated: Secondary | ICD-10-CM | POA: Insufficient documentation

## 2013-08-11 DIAGNOSIS — R209 Unspecified disturbances of skin sensation: Secondary | ICD-10-CM | POA: Insufficient documentation

## 2013-08-11 DIAGNOSIS — Z87828 Personal history of other (healed) physical injury and trauma: Secondary | ICD-10-CM | POA: Insufficient documentation

## 2013-08-11 DIAGNOSIS — R0602 Shortness of breath: Secondary | ICD-10-CM | POA: Insufficient documentation

## 2013-08-11 DIAGNOSIS — R110 Nausea: Secondary | ICD-10-CM | POA: Insufficient documentation

## 2013-08-11 DIAGNOSIS — Z3202 Encounter for pregnancy test, result negative: Secondary | ICD-10-CM | POA: Insufficient documentation

## 2013-08-11 DIAGNOSIS — R5383 Other fatigue: Secondary | ICD-10-CM

## 2013-08-11 DIAGNOSIS — R42 Dizziness and giddiness: Secondary | ICD-10-CM | POA: Insufficient documentation

## 2013-08-11 DIAGNOSIS — R51 Headache: Secondary | ICD-10-CM | POA: Insufficient documentation

## 2013-08-11 DIAGNOSIS — I10 Essential (primary) hypertension: Secondary | ICD-10-CM | POA: Insufficient documentation

## 2013-08-11 DIAGNOSIS — R0789 Other chest pain: Secondary | ICD-10-CM

## 2013-08-11 DIAGNOSIS — R5381 Other malaise: Secondary | ICD-10-CM | POA: Insufficient documentation

## 2013-08-11 LAB — BASIC METABOLIC PANEL
BUN: 9 mg/dL (ref 6–23)
CHLORIDE: 100 meq/L (ref 96–112)
CO2: 24 meq/L (ref 19–32)
Calcium: 9.4 mg/dL (ref 8.4–10.5)
Creatinine, Ser: 0.8 mg/dL (ref 0.50–1.10)
GFR calc Af Amer: >90 mL/min (ref >90)
GFR calc non Af Amer: >90 mL/min (ref >90)
GLUCOSE: 90 mg/dL (ref 70–99)
POTASSIUM: 3.5 meq/L — AB (ref 3.7–5.3)
Sodium: 140 mEq/L (ref 137–147)

## 2013-08-11 LAB — CBC
HEMATOCRIT: 41.2 % (ref 36.0–46.0)
HEMOGLOBIN: 14.9 g/dL (ref 12.0–15.0)
MCH: 32.5 pg (ref 26.0–34.0)
MCHC: 36.2 g/dL — AB (ref 30.0–36.0)
MCV: 89.8 fL (ref 78.0–100.0)
Platelets: 322 10*3/uL (ref 150–400)
RBC: 4.59 MIL/uL (ref 3.87–5.11)
RDW: 11.7 % (ref 11.5–15.5)
WBC: 7.9 10*3/uL (ref 4.0–10.5)

## 2013-08-11 LAB — PRO B NATRIURETIC PEPTIDE: Pro B Natriuretic peptide (BNP): 159.3 pg/mL — ABNORMAL HIGH (ref 0–125)

## 2013-08-11 LAB — D-DIMER, QUANTITATIVE (NOT AT ARMC): D-Dimer, Quant: <0.27 ug/mL-FEU (ref 0.00–0.48)

## 2013-08-11 LAB — PREGNANCY, URINE: PREG TEST UR: NEGATIVE

## 2013-08-11 LAB — I-STAT TROPONIN, ED: TROPONIN I, POC: 0 ng/mL (ref 0.00–0.08)

## 2013-08-11 MED ORDER — NITROGLYCERIN 0.4 MG SL SUBL: 0.4000 mg | SUBLINGUAL_TABLET | SUBLINGUAL | Status: DC | PRN

## 2013-08-11 MED ORDER — IBUPROFEN 800 MG PO TABS: 800.0000 mg | ORAL_TABLET | Freq: Three times a day (TID) | ORAL | Status: DC

## 2013-08-11 MED ORDER — ASPIRIN 325 MG PO TABS
325.0000 mg | ORAL_TABLET | ORAL | Status: AC
  Administered 2013-08-11: 325 mg via ORAL
  Filled 2013-08-11: qty 1

## 2013-08-11 NOTE — ED Notes (Signed)
Pt reports that she has been having a lot of stress lately

## 2013-08-11 NOTE — ED Provider Notes (Signed)
CSN: [id number]     Arrival date & time 08/11/13  1734 History   First MD Initiated Contact with Patient 08/11/13 1911     Chief Complaint  Patient presents with  . Chest Pain     (Consider location/radiation/quality/duration/timing/severity/associated sxs/prior Treatment) HPI Comments: Patient presents with chest pain, lightheadedness, nausea shortness of breath and tingling in her arms onset while she was at work. This was around 4:30 PM. Symptoms have been constant since then gradually improving. She is working at [employer] not do any heavy lifting. She's never had these symptoms before. She is not having an argument. Denies any vomiting. Denies any focal weakness. Denies any previous heart history. She is not taking medications. She is not on birth control. She denies any leg pain or swelling. She also has a gradual onset headache.  The history is provided by the patient.    Past Medical History  Diagnosis Date  . Shoulder injury   . Ankle injuries   . Headache(784.0)   . 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 Hypertension    History reviewed. No pertinent past surgical history. Family History  Problem Relation Age of Onset  . Diabetes    . Alcohol abuse    . Drug abuse    . Other      cv   History  Substance Use Topics  . Smoking status: Current Every Day Smoker -- 0.50 packs/day  . Smokeless tobacco: Not on file  . Alcohol Use: Yes   OB History   Grav Para Term Preterm Abortions TAB SAB Ect Mult Living                 Review of Systems  Constitutional: Positive for activity change and appetite change. Negative for fever.  HENT: Negative for congestion and rhinorrhea.   Respiratory: Positive for chest tightness and shortness of breath. Negative for cough.   Cardiovascular: Positive for chest pain.  Gastrointestinal: Positive for nausea. Negative for vomiting and abdominal pain.  Genitourinary: Negative for dysuria, hematuria, vaginal  bleeding and vaginal discharge.  Musculoskeletal: Negative for back pain.  Skin: Negative for rash.  Neurological: Positive for dizziness, weakness, light-headedness and numbness.  A complete 10 system review of systems was obtained and all systems are negative except as noted in the HPI and PMH.      Allergies  Review of patient's allergies indicates no known allergies.  Home Medications   Current Outpatient Rx  Name  Route  Sig  Dispense  Refill  . ibuprofen (ADVIL,MOTRIN) 800 MG tablet   Oral   Take 1 tablet (800 mg total) by mouth 3 (three) times daily.   21 tablet   0    BP 123/88  Pulse 60  Temp(Src) 97.7 F (36.5 C) (Oral)  Resp 18  Ht 5\' 7"  (1.702 m)  Wt 125 lb (56.7 kg)  BMI 19.57 kg/m2  SpO2 99%  LMP 08/09/2013 Physical Exam  Constitutional: She is oriented to person, place, and time. She appears well-developed and well-nourished. No distress.  HENT:  Head: Normocephalic and atraumatic.  Mouth/Throat: Oropharynx is clear and moist. No oropharyngeal exudate.  Eyes: Conjunctivae and EOM are normal. Pupils are equal, round, and reactive to light.  Neck: Normal range of motion. Neck supple.  Cardiovascular: Normal rate, regular rhythm and normal heart sounds.   No murmur heard. Pulmonary/Chest: Effort normal and breath sounds normal. No respiratory distress. She exhibits tenderness.  Reproducible midsternal tenderness  Abdominal:  Soft. There is no tenderness. There is no rebound and no guarding.  Musculoskeletal: Normal range of motion. She exhibits no edema and no tenderness.  Neurological: She is alert and oriented to person, place, and time. No cranial nerve deficit. She exhibits normal muscle tone. Coordination normal.  CN 2-12 intact, no ataxia on finger to nose, no nystagmus, 5/5 strength throughout, no pronator drift, Romberg negative, normal gait.   Skin: Skin is warm.    ED Course  Procedures (including critical care time) Labs Review Labs  Reviewed  CBC - Abnormal; Notable for the following:    MCHC 36.2 (*)    All other components within normal limits  BASIC METABOLIC PANEL - Abnormal; Notable for the following:    Potassium 3.5 (*)    All other components within normal limits  PRO B NATRIURETIC PEPTIDE - Abnormal; Notable for the following:    Pro B Natriuretic peptide (BNP) 159.3 (*)    All other components within normal limits  PREGNANCY, URINE  D-DIMER, QUANTITATIVE  I-STAT TROPOININ, ED   Imaging Review Dg Chest 2 View  08/11/2013   CLINICAL DATA:  Chest pain and nausea  EXAM: CHEST  2 VIEW  COMPARISON:  DG CHEST 2 VIEW dated 12/03/2012  FINDINGS: The heart size and mediastinal contours are within normal limits. Both lungs are clear. The visualized skeletal structures are unremarkable.  IMPRESSION: No active cardiopulmonary disease.   Electronically Signed   By: Esperanza Heiraymond  Rubner M.D.   On: 08/11/2013 21:41     EKG Interpretation   Date/Time:  Sunday August 11 2013 17:37:39 EDT Ventricular Rate:  85 PR Interval:  138 QRS Duration: 82 QT Interval:  356 QTC Calculation: 423 R Axis:   91 Text Interpretation:  Normal sinus rhythm Rightward axis ST abnormality,  possible digitalis effect Abnormal ECG No previous ECGs available  Confirmed by Tarah Buboltz  MD, Napoleon Monacelli [phone] on 08/11/2013 7:34:29 PM      MDM   Final diagnoses:  Atypical chest pain   Substernal chest pain with nausea, shortness of breath, lightheadedness onset 3 hours ago. Gradually improving. No cardiac history. No cardiac risk factors other than smoking.  Pain is reproducible on Exam, atypical for ACS. EKG nsr Troponin negative, d-dimer negative. CXR negative.  Suspect component of anxiety given constellation of symptoms.  Doubt ACS or PE. Tolerating PO in the ED and ambulatory. Follow up with PCP. Return precautions discussed.    Glynn OctaveStephen Mindi Akerson, MD 08/12/13 [phone]

## 2013-08-11 NOTE — ED Notes (Signed)
Pt was at work when she began having CP.  Pt states that she felt like she was going to faint and then began having CP and nausea.  Pt reports that she has sob with this also

## 2013-08-11 NOTE — Discharge Instructions (Signed)
Chest Pain (Nonspecific) °There is no evidence of heart attack or blood clot in the lung. Followup with your doctor. Return to the ED if you develop new or worsening symptoms. °It is often hard to give a specific diagnosis for the cause of chest pain. There is always a chance that your pain could be related to something serious, such as a heart attack or a blood clot in the lungs. You need to follow up with your caregiver for further evaluation. °CAUSES  °· Heartburn. °· Pneumonia or bronchitis. °· Anxiety or stress. °· Inflammation around your heart (pericarditis) or lung (pleuritis or pleurisy). °· A blood clot in the lung. °· A collapsed lung (pneumothorax). It can develop suddenly on its own (spontaneous pneumothorax) or from injury (trauma) to the chest. °· Shingles infection (herpes zoster virus). °The chest wall is composed of bones, muscles, and cartilage. Any of these can be the source of the pain. °· The bones can be bruised by injury. °· The muscles or cartilage can be strained by coughing or overwork. °· The cartilage can be affected by inflammation and become sore (costochondritis). °DIAGNOSIS  °Lab tests or other studies, such as X-rays, electrocardiography, stress testing, or cardiac imaging, may be needed to find the cause of your pain.  °TREATMENT  °· Treatment depends on what may be causing your chest pain. Treatment may include: °· Acid blockers for heartburn. °· Anti-inflammatory medicine. °· Pain medicine for inflammatory conditions. °· Antibiotics if an infection is present. °· You may be advised to change lifestyle habits. This includes stopping smoking and avoiding alcohol, caffeine, and chocolate. °· You may be advised to keep your head raised (elevated) when sleeping. This reduces the chance of acid going backward from your stomach into your esophagus. °· Most of the time, nonspecific chest pain will improve within 2 to 3 days with rest and mild pain medicine. °HOME CARE INSTRUCTIONS  °· If  antibiotics were prescribed, take your antibiotics as directed. Finish them even if you start to feel better. °· For the next few days, avoid physical activities that bring on chest pain. Continue physical activities as directed. °· Do not smoke. °· Avoid drinking alcohol. °· Only take over-the-counter or prescription medicine for pain, discomfort, or fever as directed by your caregiver. °· Follow your caregiver's suggestions for further testing if your chest pain does not go away. °· Keep any follow-up appointments you made. If you do not go to an appointment, you could develop lasting (chronic) problems with pain. If there is any problem keeping an appointment, you must call to reschedule. °SEEK MEDICAL CARE IF:  °· You think you are having problems from the medicine you are taking. Read your medicine instructions carefully. °· Your chest pain does not go away, even after treatment. °· You develop a rash with blisters on your chest. °SEEK IMMEDIATE MEDICAL CARE IF:  °· You have increased chest pain or pain that spreads to your arm, neck, jaw, back, or abdomen. °· You develop shortness of breath, an increasing cough, or you are coughing up blood. °· You have severe back or abdominal pain, feel nauseous, or vomit. °· You develop severe weakness, fainting, or chills. °· You have a fever. °THIS IS AN EMERGENCY. Do not wait to see if the pain will go away. Get medical help at once. Call your local emergency services (911 in [place] Do not drive yourself to the hospital. °MAKE SURE YOU:  °· Understand these instructions. °· Will watch your condition. °·   Will get help right away if you are not doing well or get worse. °Document Released: 01/26/2005 Document Revised: 07/11/2011 Document Reviewed: 11/22/2007 °[employer]® Patient Information ©2014 [employer]. ° °

## 2015-01-08 ENCOUNTER — Encounter (HOSPITAL_COMMUNITY): Payer: Self-pay | Admitting: Emergency Medicine

## 2015-01-08 ENCOUNTER — Emergency Department (HOSPITAL_COMMUNITY): Payer: Self-pay

## 2015-01-08 ENCOUNTER — Emergency Department (HOSPITAL_COMMUNITY)
Admission: EM | Admit: 2015-01-08 | Discharge: 2015-01-08 | Disposition: A | Discharge status: Home / Self Care | Payer: Self-pay | Attending: Emergency Medicine | Admitting: Emergency Medicine

## 2015-01-08 DIAGNOSIS — Z79899 Other long term (current) drug therapy: Secondary | ICD-10-CM | POA: Insufficient documentation

## 2015-01-08 DIAGNOSIS — R079 Chest pain, unspecified: Secondary | ICD-10-CM | POA: Insufficient documentation

## 2015-01-08 DIAGNOSIS — Z87828 Personal history of other (healed) physical injury and trauma: Secondary | ICD-10-CM | POA: Insufficient documentation

## 2015-01-08 DIAGNOSIS — R51 Headache: Secondary | ICD-10-CM | POA: Insufficient documentation

## 2015-01-08 DIAGNOSIS — R5383 Other fatigue: Secondary | ICD-10-CM | POA: Insufficient documentation

## 2015-01-08 DIAGNOSIS — R519 Headache, unspecified: Secondary | ICD-10-CM

## 2015-01-08 DIAGNOSIS — I10 Essential (primary) hypertension: Secondary | ICD-10-CM | POA: Insufficient documentation

## 2015-01-08 DIAGNOSIS — Z720 Tobacco use: Secondary | ICD-10-CM | POA: Insufficient documentation

## 2015-01-08 LAB — BASIC METABOLIC PANEL
ANION GAP: 11 (ref 5–15)
BUN: 9 mg/dL (ref 6–20)
CALCIUM: 9.9 mg/dL (ref 8.9–10.3)
CO2: 24 mmol/L (ref 22–32)
Chloride: 104 mmol/L (ref 101–111)
Creatinine, Ser: 0.8 mg/dL (ref 0.44–1.00)
GFR calc Af Amer: >60 mL/min (ref >60)
GLUCOSE: 109 mg/dL — AB (ref 65–99)
Potassium: 3.8 mmol/L (ref 3.5–5.1)
Sodium: 139 mmol/L (ref 135–145)

## 2015-01-08 LAB — CBC
HEMATOCRIT: 45.2 % (ref 36.0–46.0)
HEMOGLOBIN: 15.4 g/dL — AB (ref 12.0–15.0)
MCH: 30.6 pg (ref 26.0–34.0)
MCHC: 34.1 g/dL (ref 30.0–36.0)
MCV: 89.9 fL (ref 78.0–100.0)
Platelets: 301 10*3/uL (ref 150–400)
RBC: 5.03 MIL/uL (ref 3.87–5.11)
RDW: 12.6 % (ref 11.5–15.5)
WBC: 9 10*3/uL (ref 4.0–10.5)

## 2015-01-08 LAB — I-STAT TROPONIN, ED: TROPONIN I, POC: 0 ng/mL (ref 0.00–0.08)

## 2015-01-08 MED ORDER — KETOROLAC TROMETHAMINE 30 MG/ML IJ SOLN
30.0000 mg | Freq: Once | INTRAMUSCULAR | Status: AC
  Administered 2015-01-08: 30 mg via INTRAVENOUS
  Filled 2015-01-08: qty 1

## 2015-01-08 MED ORDER — METOCLOPRAMIDE HCL 5 MG/ML IJ SOLN
10.0000 mg | Freq: Once | INTRAMUSCULAR | Status: AC
  Administered 2015-01-08: 10 mg via INTRAVENOUS
  Filled 2015-01-08: qty 2

## 2015-01-08 MED ORDER — DIPHENHYDRAMINE HCL 50 MG/ML IJ SOLN
25.0000 mg | Freq: Once | INTRAMUSCULAR | Status: AC
  Administered 2015-01-08: 25 mg via INTRAVENOUS
  Filled 2015-01-08: qty 1

## 2015-01-08 MED ORDER — SODIUM CHLORIDE 0.9 % IV BOLUS (SEPSIS)
1000.0000 mL | Freq: Once | INTRAVENOUS | Status: AC
Start: 2015-01-08 — End: 2015-01-08
  Administered 2015-01-08: 1000 mL via INTRAVENOUS

## 2015-01-08 NOTE — ED Notes (Signed)
[facility] contacted to inform them that pt is discharged and pt is aware that she is to call them for transport.

## 2015-01-08 NOTE — ED Notes (Signed)
Pt reports h/a for a "while now."  Is in rehab at [facility] for [facility].  Pt is calm and cooperative.  Ambulatory with steady gait.

## 2015-01-08 NOTE — ED Provider Notes (Signed)
CSN: [id number]     Arrival date & time 01/08/15  1521 History   First MD Initiated Contact with Patient 01/08/15 1656     Chief Complaint  Patient presents with  . Headache  . Chest Pain     (Consider location/radiation/quality/duration/timing/severity/associated sxs/prior Treatment) HPI Comments: Patient presents to the emergency department with chief complaint of headache, chest pain, fatigue, and feeling dizzy. She also reports that she was hypertensive. 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 She has been staying at [facility]. She states that her symptoms started today. She denies any fever, chills, nausea, or vomiting. She states that she is concerned about her blood pressure being high. She has never been officially diagnosed with hypertension. She states that she had some associated chest pain and headache. She denies any aggravating or alleviating factors.  No thunderclap origin.  Denies any extremity weakness.  The history is provided by the patient. No language interpreter was used.    Past Medical History  Diagnosis Date  . Shoulder injury   . Ankle injuries   . Headache(784.0)   . 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 Hypertension    History reviewed. No pertinent past surgical history. Family History  Problem Relation Age of Onset  . Diabetes    . Alcohol abuse    . Drug abuse    . Other      cv   Social History  Substance Use Topics  . Smoking status: Current Every Day Smoker -- 0.50 packs/day  . Smokeless tobacco: None  . Alcohol Use: Yes   OB History    No data available     Review of Systems  Constitutional: Positive for fatigue. Negative for fever and chills.  Respiratory: Negative for shortness of breath.   Cardiovascular: Positive for chest pain.  Gastrointestinal: Negative for nausea, vomiting, diarrhea and constipation.  Genitourinary: Negative for dysuria.  Neurological: Positive for headaches.      Allergies  Review  of patient's allergies indicates no known allergies.  Home Medications   Prior to Admission medications   Medication Sig Start Date End Date Taking? Authorizing Provider  citalopram (CELEXA) 20 MG tablet Take 20 mg by mouth daily.   Yes Historical Provider, MD  gabapentin (NEURONTIN) 300 MG capsule Take 300 mg by mouth 3 (three) times daily.  12/26/14 01/25/15 Yes Historical Provider, MD  hydrOXYzine (ATARAX/VISTARIL) 25 MG tablet Take 25 mg by mouth 3 (three) times daily as needed for anxiety.   Yes Historical Provider, MD  methocarbamol (ROBAXIN) 750 MG tablet Take 1,500 mg by mouth every 6 (six) hours as needed for muscle spasms.  12/26/14  Yes Historical Provider, MD  traZODone (DESYREL) 100 MG tablet Take 100 mg by mouth at bedtime.   Yes Historical Provider, MD  ibuprofen (ADVIL,MOTRIN) 800 MG tablet Take 1 tablet (800 mg total) by mouth 3 (three) times daily. Patient not taking: Reported on 01/08/2015 08/11/13   Glynn Octave, MD   LMP 12/18/2014 Physical Exam  Constitutional: She is oriented to person, place, and time. She appears well-developed and well-nourished.  HENT:  Head: Normocephalic and atraumatic.  Right Ear: External ear normal.  Left Ear: External ear normal.  Eyes: Conjunctivae and EOM are normal. Pupils are equal, round, and reactive to light.  Neck: Normal range of motion. Neck supple.  No pain with neck flexion, no meningismus  Cardiovascular: Normal rate, regular rhythm and normal heart sounds.  Exam reveals no gallop and no friction  rub.   No murmur heard. Pulmonary/Chest: Effort normal and breath sounds normal. No respiratory distress. She has no wheezes. She has no rales. She exhibits no tenderness.  Abdominal: Soft. Bowel sounds are normal. She exhibits no distension and no mass. There is no tenderness. There is no rebound and no guarding.  Musculoskeletal: Normal range of motion. She exhibits no edema or tenderness.  Normal gait.  Neurological: She is alert  and oriented to person, place, and time. She has normal reflexes.  CN 3-12 intact, normal finger to nose, no pronator drift, sensation and strength intact bilaterally.  Skin: Skin is warm and dry.  Psychiatric: She has a normal mood and affect. Her behavior is normal. Judgment and thought content normal.  Nursing note and vitals reviewed.   ED Course  Procedures (including critical care time) Labs Review Labs Reviewed  BASIC METABOLIC PANEL - Abnormal; Notable for the following:    Glucose, Bld 109 (*)    All other components within normal limits  CBC - Abnormal; Notable for the following:    Hemoglobin 15.4 (*)    All other components within normal limits  I-STAT TROPOININ, ED    Imaging Review Dg Chest 2 View  01/08/2015   CLINICAL DATA:  Midline chest pain and shortness of breath for 2 to 3 hr.  EXAM: CHEST  2 VIEW  COMPARISON:  08/11/2013  FINDINGS: Cardiomediastinal silhouette is normal. Mediastinal contours appear intact.  There is no evidence of focal airspace consolidation, pleural effusion or pneumothorax.  Osseous structures are without acute abnormality. Soft tissues are grossly normal.  IMPRESSION: No radiographic evidence of acute cardiopulmonary abnormality.   Electronically Signed   By: Ted Mcalpine M.D.   On: 01/08/2015 16:21   I have personally reviewed and evaluated these images and lab results as part of my medical decision-making.   EKG Interpretation None      MDM   Final diagnoses:  Headache, unspecified headache type  Chest pain, unspecified chest pain type    Patient with headache and chest pain.  Currently getting treatment on inpatient basis for heroin detox at [facility].  Sent to ED by [facility].  Patient still complains of headache and generally not feeling well.  Has been taking neurontin and robaxin.  I doubt withdrawal as the patient's last use was 3 weeks ago.  Low risk for ACS/PE.  Will treat headache with toradol, reglan, and benadryl.  Will  give fluids and reassess.  Patient reassessed, feeling much better. Labs and workup is reassuring. Patient is stable and ready for discharge    Roxy Horseman, PA-C 01/08/15 1858  Linwood Dibbles, MD 01/08/15 1901

## 2015-01-08 NOTE — ED Notes (Addendum)
Pt states that she "detoxed" from IV heroin at [facility] 2 weeks ago then went to [facility] and now at [facility].  Pt states that earlier today started having headache, chest pain, bilat leg pain and dizziness.  Pt states that she does smoke and never been diagnosed with hypertension but BP always is high.  Pt was given  ASA at [facility].

## 2015-01-08 NOTE — ED Notes (Signed)
Pt to call [facility] for transport back

## 2015-01-08 NOTE — Discharge Instructions (Signed)
Chest Pain (Nonspecific) °It is often hard to give a specific diagnosis for the cause of chest pain. There is always a chance that your pain could be related to something serious, such as a heart attack or a blood clot in the lungs. You need to follow up with your health care provider for further evaluation. °CAUSES  °· Heartburn. °· Pneumonia or bronchitis. °· Anxiety or stress. °· Inflammation around your heart (pericarditis) or lung (pleuritis or pleurisy). °· A blood clot in the lung. °· A collapsed lung (pneumothorax). It can develop suddenly on its own (spontaneous pneumothorax) or from trauma to the chest. °· Shingles infection (herpes zoster virus). °The chest wall is composed of bones, muscles, and cartilage. Any of these can be the source of the pain. °· The bones can be bruised by injury. °· The muscles or cartilage can be strained by coughing or overwork. °· The cartilage can be affected by inflammation and become sore (costochondritis). °DIAGNOSIS  °Lab tests or other studies may be needed to find the cause of your pain. Your health care provider may have you take a test called an ambulatory electrocardiogram (ECG). An ECG records your heartbeat patterns over a 24-hour period. You may also have other tests, such as: °· Transthoracic echocardiogram (TTE). During echocardiography, sound waves are used to evaluate how blood flows through your heart. °· Transesophageal echocardiogram (TEE). °· Cardiac monitoring. This allows your health care provider to monitor your heart rate and rhythm in real time. °· Holter monitor. This is a portable device that records your heartbeat and can help diagnose heart arrhythmias. It allows your health care provider to track your heart activity for several days, if needed. °· Stress tests by exercise or by giving medicine that makes the heart beat faster. °TREATMENT  °· Treatment depends on what may be causing your chest pain. Treatment may include: °¨ Acid blockers for  heartburn. °¨ Anti-inflammatory medicine. °¨ Pain medicine for inflammatory conditions. °¨ Antibiotics if an infection is present. °· You may be advised to change lifestyle habits. This includes stopping smoking and avoiding alcohol, caffeine, and chocolate. °· You may be advised to keep your head raised (elevated) when sleeping. This reduces the chance of acid going backward from your stomach into your esophagus. °Most of the time, nonspecific chest pain will improve within 2-3 days with rest and mild pain medicine.  °HOME CARE INSTRUCTIONS  °· If antibiotics were prescribed, take them as directed. Finish them even if you start to feel better. °· For the next few days, avoid physical activities that bring on chest pain. Continue physical activities as directed. °· Do not use any tobacco products, including cigarettes, chewing tobacco, or electronic cigarettes. °· Avoid drinking alcohol. °· Only take medicine as directed by your health care provider. °· Follow your health care provider's suggestions for further testing if your chest pain does not go away. °· Keep any follow-up appointments you made. If you do not go to an appointment, you could develop lasting (chronic) problems with pain. If there is any problem keeping an appointment, call to reschedule. °SEEK MEDICAL CARE IF:  °· Your chest pain does not go away, even after treatment. °· You have a rash with blisters on your chest. °· You have a fever. °SEEK IMMEDIATE MEDICAL CARE IF:  °· You have increased chest pain or pain that spreads to your arm, neck, jaw, back, or abdomen. °· You have shortness of breath. °· You have an increasing cough, or you cough   up blood. °· You have severe back or abdominal pain. °· You feel nauseous or vomit. °· You have severe weakness. °· You faint. °· You have chills. °This is an emergency. Do not wait to see if the pain will go away. Get medical help at once. Call your local emergency services (911 in [place] Do not drive  yourself to the hospital. °MAKE SURE YOU:  °· Understand these instructions. °· Will watch your condition. °· Will get help right away if you are not doing well or get worse. °Document Released: 01/26/2005 Document Revised: 04/23/2013 Document Reviewed: 11/22/2007 °ExitCare® Patient Information ©2015 [employer]. This information is not intended to replace advice given to you by your health care provider. Make sure you discuss any questions you have with your health care provider. °General Headache Without Cause °A headache is pain or discomfort felt around the head or neck area. The specific cause of a headache may not be found. There are many causes and types of headaches. A few common ones are: °· Tension headaches. °· Migraine headaches. °· Cluster headaches. °· Chronic daily headaches. °HOME CARE INSTRUCTIONS  °· Keep all follow-up appointments with your caregiver or any specialist referral. °· Only take over-the-counter or prescription medicines for pain or discomfort as directed by your caregiver. °· Lie down in a dark, quiet room when you have a headache. °· Keep a headache journal to find out what may trigger your migraine headaches. For example, write down: °¨ What you eat and drink. °¨ How much sleep you get. °¨ Any change to your diet or medicines. °· Try massage or other relaxation techniques. °· Put ice packs or heat on the head and neck. Use these 3 to 4 times per day for 15 to 20 minutes each time, or as needed. °· Limit stress. °· Sit up straight, and do not tense your muscles. °· Quit smoking if you smoke. °· Limit alcohol use. °· Decrease the amount of caffeine you drink, or stop drinking caffeine. °· Eat and sleep on a regular schedule. °· Get 7 to 9 hours of sleep, or as recommended by your caregiver. °· Keep lights dim if bright lights bother you and make your headaches worse. °SEEK MEDICAL CARE IF:  °· You have problems with the medicines you were prescribed. °· Your medicines are not  working. °· You have a change from the usual headache. °· You have nausea or vomiting. °SEEK IMMEDIATE MEDICAL CARE IF:  °· Your headache becomes severe. °· You have a fever. °· You have a stiff neck. °· You have loss of vision. °· You have muscular weakness or loss of muscle control. °· You start losing your balance or have trouble walking. °· You feel faint or pass out. °· You have severe symptoms that are different from your first symptoms. °MAKE SURE YOU:  °· Understand these instructions. °· Will watch your condition. °· Will get help right away if you are not doing well or get worse. °Document Released: 04/18/2005 Document Revised: 07/11/2011 Document Reviewed: 05/04/2011 °ExitCare® Patient Information ©2015 [employer]. This information is not intended to replace advice given to you by your health care provider. Make sure you discuss any questions you have with your health care provider. ° °

## 2017-01-20 ENCOUNTER — Encounter: Payer: Self-pay | Admitting: Internal Medicine

## 2017-02-03 ENCOUNTER — Encounter (HOSPITAL_COMMUNITY): Payer: Self-pay | Admitting: Emergency Medicine

## 2017-02-03 ENCOUNTER — Emergency Department (HOSPITAL_COMMUNITY)
Admission: EM | Admit: 2017-02-03 | Discharge: 2017-02-03 | Disposition: A | Discharge status: Home / Self Care | Payer: Self-pay | Attending: Emergency Medicine | Admitting: Emergency Medicine

## 2017-02-03 DIAGNOSIS — F4323 Adjustment disorder with mixed anxiety and depressed mood: Secondary | ICD-10-CM | POA: Insufficient documentation

## 2017-02-03 DIAGNOSIS — I10 Essential (primary) hypertension: Secondary | ICD-10-CM | POA: Insufficient documentation

## 2017-02-03 DIAGNOSIS — F17200 Nicotine dependence, unspecified, uncomplicated: Secondary | ICD-10-CM | POA: Insufficient documentation

## 2017-02-03 DIAGNOSIS — Z79899 Other long term (current) drug therapy: Secondary | ICD-10-CM | POA: Insufficient documentation

## 2017-02-03 LAB — COMPREHENSIVE METABOLIC PANEL
ALT: 20 U/L (ref 14–54)
ANION GAP: 11 (ref 5–15)
AST: 27 U/L (ref 15–41)
Albumin: 4.3 g/dL (ref 3.5–5.0)
Alkaline Phosphatase: 70 U/L (ref 38–126)
BILIRUBIN TOTAL: 0.4 mg/dL (ref 0.3–1.2)
BUN: 8 mg/dL (ref 6–20)
CO2: 24 mmol/L (ref 22–32)
Calcium: 9.6 mg/dL (ref 8.9–10.3)
Chloride: 101 mmol/L (ref 101–111)
Creatinine, Ser: 0.89 mg/dL (ref 0.44–1.00)
GFR calc Af Amer: >60 mL/min (ref >60)
GFR calc non Af Amer: >60 mL/min (ref >60)
GLUCOSE: 88 mg/dL (ref 65–99)
Potassium: 3.5 mmol/L (ref 3.5–5.1)
Sodium: 136 mmol/L (ref 135–145)
TOTAL PROTEIN: 6.8 g/dL (ref 6.5–8.1)

## 2017-02-03 LAB — URINALYSIS, ROUTINE W REFLEX MICROSCOPIC
Bilirubin Urine: NEGATIVE
Glucose, UA: NEGATIVE mg/dL
Ketones, ur: NEGATIVE mg/dL
Leukocytes, UA: NEGATIVE
NITRITE: NEGATIVE
PROTEIN: NEGATIVE mg/dL
SPECIFIC GRAVITY, URINE: 1.009 (ref 1.005–1.030)
pH: 5 (ref 5.0–8.0)

## 2017-02-03 LAB — CBC WITH DIFFERENTIAL/PLATELET
BASOS PCT: 1 %
Basophils Absolute: 0.1 10*3/uL (ref 0.0–0.1)
Eosinophils Absolute: 0.1 10*3/uL (ref 0.0–0.7)
Eosinophils Relative: 1 %
HEMATOCRIT: 39.1 % (ref 36.0–46.0)
HEMOGLOBIN: 13.2 g/dL (ref 12.0–15.0)
LYMPHS PCT: 25 %
Lymphs Abs: 2.2 10*3/uL (ref 0.7–4.0)
MCH: 31.8 pg (ref 26.0–34.0)
MCHC: 33.8 g/dL (ref 30.0–36.0)
MCV: 94.2 fL (ref 78.0–100.0)
Monocytes Absolute: 0.4 10*3/uL (ref 0.1–1.0)
Monocytes Relative: 4 %
NEUTROS ABS: 6 10*3/uL (ref 1.7–7.7)
NEUTROS PCT: 69 %
Platelets: 371 10*3/uL (ref 150–400)
RBC: 4.15 MIL/uL (ref 3.87–5.11)
RDW: 11.9 % (ref 11.5–15.5)
WBC: 8.6 10*3/uL (ref 4.0–10.5)

## 2017-02-03 LAB — POC URINE PREG, ED: Preg Test, Ur: NEGATIVE

## 2017-02-03 MED ORDER — LOSARTAN POTASSIUM-HCTZ 100-25 MG PO TABS: 1.0000 | ORAL_TABLET | Freq: Every day | ORAL | 0 refills | Status: AC

## 2017-02-03 MED ORDER — HYDROCHLOROTHIAZIDE 25 MG PO TABS
25.0000 mg | ORAL_TABLET | Freq: Once | ORAL | Status: AC
  Administered 2017-02-03: 25 mg via ORAL
  Filled 2017-02-03: qty 1

## 2017-02-03 MED ORDER — LOSARTAN POTASSIUM 50 MG PO TABS
100.0000 mg | ORAL_TABLET | Freq: Once | ORAL | Status: AC
  Administered 2017-02-03: 100 mg via ORAL
  Filled 2017-02-03: qty 2

## 2017-02-03 NOTE — ED Triage Notes (Signed)
Pt to ED with c/o hypertension and headache.  St's she has been out of her blood pressure meds for 1 week.  Pt also c/o lower back pain

## 2017-02-03 NOTE — Discharge Instructions (Signed)
It was our pleasure to provide your ER care today - we hope that you feel better.  Your blood pressure is high - take blood pressure medication as prescribed, limit salt intake, and follow up with primary care doctor in the coming week.  Return to ER if worse, new symptoms, other concern.

## 2017-02-03 NOTE — ED Provider Notes (Addendum)
[facility]-EMERGENCY DEPT Provider Note   CSN: [id number] Arrival date & time: 02/03/17  1829     History   Chief Complaint Chief Complaint  Patient presents with  . Hypertension  . Headache    HPI Kim Brooks is a 26 y.o. female.  Patient w hx htn, c/o feeling as if bp has been high. Sates out of her bp med, losartan for approx 1 month. States when taking it her bp is better controlled. Has noted intermittent headaches. Dull. Mild, gradual onset. Similar to prior headaches. No acute, severe or abrupt headaches. No eye pain or change in vision. No neck pain or stiffness. No numbness/weakness. Denies chest pain. No sob. No leg edema. Normal appetite.    The history is provided by the patient.  Hypertension  Associated symptoms include headaches. Pertinent negatives include no chest pain and no shortness of breath.  Headache   Pertinent negatives include no fever, no shortness of breath and no vomiting.    Past Medical History:  Diagnosis Date  . Ankle injuries   . Encounter for drug rehabilitation 2010   opiate  . Headache(784.0)   . Hypertension   . Shoulder injury     Patient Active Problem List   Diagnosis Date Noted  . Screening for STD (sexually transmitted disease) 01/02/2013  . Bacterial vaginosis 01/02/2013  . Vaginal ulceration 12/26/2012  . Elevated blood pressure reading 10/30/2012  . Dizziness and giddiness 10/30/2012  . Abdominal pain, acute 09/07/2011  . Weight loss, unintentional 04/01/2011  . Postprandial epigastric pain 04/01/2011  . INFECTION, SKIN AND SOFT TISSUE 01/11/2010  . GASTROENTERITIS 01/08/2010  . RASH 01/08/2010  . OTITIS MEDIA, CHRONIC 09/22/2009  . UNSPECIFIED DRUG DEPENDENCE IN REMISSION 07/03/2009  . TOBACCO USE 07/03/2009  . ALLERGIC RHINITIS, SEASONAL 07/03/2009  . 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 THYROID FUNCTION TEST, ABNORMAL 05/14/2008  .  FATIGUE 03/25/2008  . ENLARGEMENT OF LYMPH NODES 03/25/2008    History reviewed. No pertinent surgical history.  OB History    No data available       Home Medications    Prior to Admission medications   Medication Sig Start Date End Date Taking? Authorizing Provider  citalopram (CELEXA) 20 MG tablet Take 20 mg by mouth daily.    [provider]  gabapentin (NEURONTIN) 300 MG capsule Take 300 mg by mouth 3 (three) times daily.  12/26/14 01/25/15  [provider]  hydrOXYzine (ATARAX/VISTARIL) 25 MG tablet Take 25 mg by mouth 3 (three) times daily as needed for anxiety.    [provider]  ibuprofen (ADVIL,MOTRIN) 800 MG tablet Take 1 tablet (800 mg total) by mouth 3 (three) times daily. Patient not taking: Reported on 01/08/2015 08/11/13   Glynn Octave, MD  methocarbamol (ROBAXIN) 750 MG tablet Take 1,500 mg by mouth every 6 (six) hours as needed for muscle spasms.  12/26/14   [provider]  traZODone (DESYREL) 100 MG tablet Take 100 mg by mouth at bedtime.    [provider]    Family History Family History  Problem Relation Age of Onset  . Diabetes Unknown   . Alcohol abuse Unknown   . Drug abuse Unknown   . Other Unknown        cv    Social History Social History  Substance Use Topics  . Smoking status: Current Every Day Smoker    Packs/day: 0.50  . Smokeless tobacco:  Never Used  . Alcohol use Yes     Allergies   Patient has no known allergies.   Review of Systems Review of Systems  Constitutional: Negative for fever.  HENT: Negative for congestion, sinus pressure and sore throat.   Eyes: Negative for visual disturbance.  Respiratory: Negative for cough and shortness of breath.   Cardiovascular: Negative for chest pain.  Gastrointestinal: Negative for vomiting.  Musculoskeletal: Negative for neck pain and neck stiffness.  Neurological: Positive for headaches. Negative for speech difficulty, weakness and  numbness.     Physical Exam Updated Vital Signs BP (!) 180/125 (BP Location: Right Arm)   Pulse 99   Temp 98.4 F (36.9 C) (Oral)   Resp 17   Ht 1.702 m ( )   Wt 68 kg (150 lb)   LMP 01/27/2017 (Exact Date)   SpO2 100%   BMI 23.49 kg/m   Physical Exam  Constitutional: She is oriented to person, place, and time. She appears well-developed and well-nourished. No distress.  HENT:  Head: Atraumatic.  Nose: Nose normal.  Mouth/Throat: Oropharynx is clear and moist.  No sinus or temporal tenderness.  Eyes: Pupils are equal, round, and reactive to light. Conjunctivae are normal. No scleral icterus.  Neck: Neck supple. No tracheal deviation present. No thyromegaly present.  No stiffness or rigidity.   Cardiovascular: Normal rate, regular rhythm, normal heart sounds and intact distal pulses.  Exam reveals no gallop and no friction rub.   No murmur heard. Pulmonary/Chest: Effort normal and breath sounds normal. No respiratory distress.  Abdominal: Soft. Normal appearance and bowel sounds are normal. She exhibits no distension. There is no tenderness.  Genitourinary:  Genitourinary Comments: No cva tenderness.  Musculoskeletal: She exhibits no edema or tenderness.  Neurological: She is alert and oriented to person, place, and time. No cranial nerve deficit.  Speech clear/fluent. Motor intact bilaterally. Steady gait.   Skin: Skin is warm and dry. No rash noted. She is not diaphoretic.  Psychiatric: She has a normal mood and affect.  Nursing note and vitals reviewed.    ED Treatments / Results  Labs (all labs ordered are listed, but only abnormal results are displayed) Results for orders placed or performed during the hospital encounter of 02/03/17  Urinalysis, Routine w reflex microscopic  Result Value Ref Range   Color, Urine STRAW (A) YELLOW   APPearance CLEAR CLEAR   Specific Gravity, Urine 1.009 1.005 - 1.030   pH 5.0 5.0 - 8.0   Glucose, UA NEGATIVE NEGATIVE mg/dL     Hgb urine dipstick SMALL (A) NEGATIVE   Bilirubin Urine NEGATIVE NEGATIVE   Ketones, ur NEGATIVE NEGATIVE mg/dL   Protein, ur NEGATIVE NEGATIVE mg/dL   Nitrite NEGATIVE NEGATIVE   Leukocytes, UA NEGATIVE NEGATIVE   RBC / HPF 0-5 0 - 5 RBC/hpf   WBC, UA 0-5 0 - 5 WBC/hpf   Bacteria, UA RARE (A) NONE SEEN   Squamous Epithelial / LPF 0-5 (A) NONE SEEN   Mucus PRESENT   CBC with Differential  Result Value Ref Range   WBC 8.6 4.0 - 10.5 K/uL   RBC 4.15 3.87 - 5.11 MIL/uL   Hemoglobin 13.2 12.0 - 15.0 g/dL   HCT 16.1 09.6 - 04.5 %   MCV 94.2 78.0 - 100.0 fL   MCH 31.8 26.0 - 34.0 pg   MCHC 33.8 30.0 - 36.0 g/dL   RDW 40.9 81.1 - 91.4 %   Platelets 371 150 - 400 K/uL   Neutrophils Relative %  69 %   Neutro Abs 6.0 1.7 - 7.7 K/uL   Lymphocytes Relative 25 %   Lymphs Abs 2.2 0.7 - 4.0 K/uL   Monocytes Relative 4 %   Monocytes Absolute 0.4 0.1 - 1.0 K/uL   Eosinophils Relative 1 %   Eosinophils Absolute 0.1 0.0 - 0.7 K/uL   Basophils Relative 1 %   Basophils Absolute 0.1 0.0 - 0.1 K/uL  Comprehensive metabolic panel  Result Value Ref Range   Sodium 136 135 - 145 mmol/L   Potassium 3.5 3.5 - 5.1 mmol/L   Chloride 101 101 - 111 mmol/L   CO2 24 22 - 32 mmol/L   Glucose, Bld 88 65 - 99 mg/dL   BUN 8 6 - 20 mg/dL   Creatinine, Ser 1.61 0.44 - 1.00 mg/dL   Calcium 9.6 8.9 - 09.6 mg/dL   Total Protein 6.8 6.5 - 8.1 g/dL   Albumin 4.3 3.5 - 5.0 g/dL   AST 27 15 - 41 U/L   ALT 20 14 - 54 U/L   Alkaline Phosphatase 70 38 - 126 U/L   Total Bilirubin 0.4 0.3 - 1.2 mg/dL   GFR calc non Af Amer >60 >60 mL/min   GFR calc Af Amer >60 >60 mL/min   Anion gap 11 5 - 15  POC Urine Pregnancy, ED (do NOT order at [facility])  Result Value Ref Range   Preg Test, Ur NEGATIVE NEGATIVE    EKG  EKG Interpretation None       Radiology No results found.  Procedures Procedures (including critical care time)  Medications Ordered in ED Medications - No data to display   Initial Impression /  Assessment and Plan / ED Course  I have reviewed the triage vital signs and the nursing notes.  Pertinent labs & imaging results that were available during my care of the patient were reviewed by me and considered in my medical decision making (see chart for details).  Pt requests refill of her losartan.   Labs sent from triage - neg for acute process.   Recheck bp.  Pt indicates she is supposed to take losartan-hctz 100-25 once a day.  Dose of her med given in ED.   bp improved.  Pt appears stable for d/c.     Final Clinical Impressions(s) / ED Diagnoses   Final diagnoses:  None    New Prescriptions New Prescriptions   No medications on file       Cathren Laine, MD 02/03/17 2304

## 2020-04-23 ENCOUNTER — Encounter (HOSPITAL_BASED_OUTPATIENT_CLINIC_OR_DEPARTMENT_OTHER): Payer: Self-pay

## 2020-04-23 ENCOUNTER — Emergency Department (HOSPITAL_BASED_OUTPATIENT_CLINIC_OR_DEPARTMENT_OTHER)
Admission: EM | Admit: 2020-04-23 | Discharge: 2020-04-23 | Disposition: A | Discharge status: Home / Self Care | Payer: Self-pay | Attending: Emergency Medicine | Admitting: Emergency Medicine

## 2020-04-23 ENCOUNTER — Emergency Department (HOSPITAL_BASED_OUTPATIENT_CLINIC_OR_DEPARTMENT_OTHER): Payer: Self-pay

## 2020-04-23 ENCOUNTER — Other Ambulatory Visit: Payer: Self-pay

## 2020-04-23 DIAGNOSIS — Z87891 Personal history of nicotine dependence: Secondary | ICD-10-CM | POA: Insufficient documentation

## 2020-04-23 DIAGNOSIS — R519 Headache, unspecified: Secondary | ICD-10-CM | POA: Insufficient documentation

## 2020-04-23 DIAGNOSIS — I10 Essential (primary) hypertension: Secondary | ICD-10-CM | POA: Insufficient documentation

## 2020-04-23 DIAGNOSIS — Z79899 Other long term (current) drug therapy: Secondary | ICD-10-CM | POA: Insufficient documentation

## 2020-04-23 DIAGNOSIS — G44209 Tension-type headache, unspecified, not intractable: Secondary | ICD-10-CM

## 2020-04-23 DIAGNOSIS — Z20822 Contact with and (suspected) exposure to covid-19: Secondary | ICD-10-CM | POA: Insufficient documentation

## 2020-04-23 LAB — BASIC METABOLIC PANEL
Anion gap: 9 (ref 5–15)
BUN: 9 mg/dL (ref 6–20)
CO2: 25 mmol/L (ref 22–32)
Calcium: 9.6 mg/dL (ref 8.9–10.3)
Chloride: 101 mmol/L (ref 98–111)
Creatinine, Ser: 0.8 mg/dL (ref 0.44–1.00)
GFR, Estimated: >60 mL/min (ref >60)
Glucose, Bld: 102 mg/dL — ABNORMAL HIGH (ref 70–99)
Potassium: 3.9 mmol/L (ref 3.5–5.1)
Sodium: 135 mmol/L (ref 135–145)

## 2020-04-23 LAB — CBC
HCT: 44.5 % (ref 36.0–46.0)
Hemoglobin: 15.4 g/dL — ABNORMAL HIGH (ref 12.0–15.0)
MCH: 31.8 pg (ref 26.0–34.0)
MCHC: 34.6 g/dL (ref 30.0–36.0)
MCV: 91.9 fL (ref 80.0–100.0)
Platelets: 410 10*3/uL — ABNORMAL HIGH (ref 150–400)
RBC: 4.84 MIL/uL (ref 3.87–5.11)
RDW: 11.9 % (ref 11.5–15.5)
WBC: 7.7 10*3/uL (ref 4.0–10.5)
nRBC: 0 % (ref 0.0–0.2)

## 2020-04-23 LAB — PREGNANCY, URINE: Preg Test, Ur: NEGATIVE

## 2020-04-23 LAB — TROPONIN I (HIGH SENSITIVITY)
Troponin I (High Sensitivity): <2 ng/L (ref <18)
Troponin I (High Sensitivity): <2 ng/L (ref <18)

## 2020-04-23 LAB — RESP PANEL BY RT-PCR (FLU A&B, COVID) ARPGX2
Influenza A by PCR: NEGATIVE
Influenza B by PCR: NEGATIVE
SARS Coronavirus 2 by RT PCR: NEGATIVE

## 2020-04-23 MED ORDER — KETOROLAC TROMETHAMINE 60 MG/2ML IM SOLN
30.0000 mg | Freq: Once | INTRAMUSCULAR | Status: AC
  Administered 2020-04-23: 19:00:00 30 mg via INTRAMUSCULAR
  Filled 2020-04-23: qty 2

## 2020-04-23 MED ORDER — BUTALBITAL-APAP-CAFFEINE 50-325-40 MG PO TABS: 1.0000 | ORAL_TABLET | Freq: Four times a day (QID) | ORAL | 0 refills | Status: AC | PRN

## 2020-04-23 MED ORDER — ACETAMINOPHEN 500 MG PO TABS
1000.0000 mg | ORAL_TABLET | Freq: Once | ORAL | Status: AC
  Administered 2020-04-23: 19:00:00 1000 mg via ORAL
  Filled 2020-04-23: qty 2

## 2020-04-23 MED ORDER — HYDRALAZINE HCL 25 MG PO TABS
25.0000 mg | ORAL_TABLET | Freq: Once | ORAL | Status: AC
  Administered 2020-04-23: 19:00:00 25 mg via ORAL
  Filled 2020-04-23: qty 1

## 2020-04-23 NOTE — ED Provider Notes (Signed)
[facility] EMERGENCY DEPARTMENT Provider Note   CSN: [id number] Arrival date & time: 04/23/20  1432     History Chief Complaint  Patient presents with  . Hypertension    Kim Brooks is a 29 y.o. female.  Presents to emergency room with concern for elevated blood pressure.  She reports for the last few days she has been having dull achy headache, frontal, not sudden onset, not worst headache of her life.  States that she has been checking her blood pressure quite frequently and it has been elevated ranging all the way up to 170s to 190s systolic.  No associated chest pain.  Reports having history of high blood pressure and is on losartan.  Denies any missed doses, takes it in the morning.  Called her primary doctor who recommended going to ER.  HPI     Past Medical History:  Diagnosis Date  . Ankle injuries   . Encounter for drug rehabilitation 2010   opiate  . Headache(784.0)   . Hypertension   . Shoulder injury     Patient Active Problem List   Diagnosis Date Noted  . Screening for STD (sexually transmitted disease) 01/02/2013  . Bacterial vaginosis 01/02/2013  . Vaginal ulceration 12/26/2012  . Elevated blood pressure reading 10/30/2012  . Dizziness and giddiness 10/30/2012  . Abdominal pain, acute 09/07/2011  . Weight loss, unintentional 04/01/2011  . Postprandial epigastric pain 04/01/2011  . INFECTION, SKIN AND SOFT TISSUE 01/11/2010  . GASTROENTERITIS 01/08/2010  . RASH 01/08/2010  . OTITIS MEDIA, CHRONIC 09/22/2009  . UNSPECIFIED DRUG DEPENDENCE IN REMISSION 07/03/2009  . TOBACCO USE 07/03/2009  . ALLERGIC RHINITIS, SEASONAL 07/03/2009  . 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 THYROID FUNCTION TEST, ABNORMAL 05/14/2008  . FATIGUE 03/25/2008  . ENLARGEMENT OF LYMPH NODES 03/25/2008    History reviewed. No pertinent surgical history.   OB History   No obstetric  history on file.     Family History  Problem Relation Age of Onset  . Diabetes Other   . Alcohol abuse Other   . Drug abuse Other   . Other Other        cv    Social History   Tobacco Use  . Smoking status: Former Smoker    Packs/day: 0.50  . Smokeless tobacco: Never Used  Vaping Use  . Vaping Use: Every day  Substance Use Topics  . Alcohol use: Yes    Comment: daily  . Drug use: No    Home Medications Prior to Admission medications   Medication Sig Start Date End Date Taking? Authorizing Provider  butalbital-acetaminophen-caffeine (FIORICET) 50-325-40 MG tablet Take 1 tablet by mouth every 6 (six) hours as needed for headache or migraine. 04/23/20 04/23/21  Milagros Loll, MD  citalopram (CELEXA) 20 MG tablet Take 20 mg by mouth daily.    [provider]  gabapentin (NEURONTIN) 300 MG capsule Take 300 mg by mouth 3 (three) times daily.  12/26/14 01/25/15  [provider]  hydrOXYzine (ATARAX/VISTARIL) 25 MG tablet Take 25 mg by mouth 3 (three) times daily as needed for anxiety.    [provider]  ibuprofen (ADVIL,MOTRIN) 800 MG tablet Take 1 tablet (800 mg total) by mouth 3 (three) times daily. Patient not taking: Reported on 01/08/2015 08/11/13   Rancour, Jeannett Senior, MD  losartan-hydrochlorothiazide (HYZAAR) 100-25 MG tablet Take 1 tablet by mouth daily. 02/03/17   Cathren Laine, MD  methocarbamol (ROBAXIN) 750 MG tablet Take 1,500 mg by mouth every 6 (six) hours as needed for muscle spasms.  12/26/14   [provider]  traZODone (DESYREL) 100 MG tablet Take 100 mg by mouth at bedtime.    [provider]    Allergies    Lisinopril  Review of Systems   Review of Systems  Constitutional: Negative for chills and fever.  HENT: Negative for ear pain and sore throat.   Eyes: Negative for pain and visual disturbance.  Respiratory: Negative for cough and shortness of breath.   Cardiovascular: Negative for chest pain and palpitations.   Gastrointestinal: Negative for abdominal pain and vomiting.  Genitourinary: Negative for dysuria and hematuria.  Musculoskeletal: Negative for arthralgias and back pain.  Skin: Negative for color change and rash.  Neurological: Positive for headaches. Negative for seizures and syncope.  All other systems reviewed and are negative.   Physical Exam Updated Vital Signs BP (!) 136/98   Pulse 92   Temp 98 F (36.7 C)   Resp 17   Ht 5\' 7"  (1.702 m)   Wt 72 kg   LMP 04/03/2020   SpO2 100%   BMI 24.86 kg/m   Physical Exam Vitals and nursing note reviewed.  Constitutional:      General: She is not in acute distress.    Appearance: She is well-developed and well-nourished.  HENT:     Head: Normocephalic and atraumatic.  Eyes:     Conjunctiva/sclera: Conjunctivae normal.  Cardiovascular:     Rate and Rhythm: Normal rate and regular rhythm.     Heart sounds: No murmur heard.   Pulmonary:     Effort: Pulmonary effort is normal. No respiratory distress.     Breath sounds: Normal breath sounds.  Abdominal:     Palpations: Abdomen is soft.     Tenderness: There is no abdominal tenderness.  Musculoskeletal:        General: No deformity, signs of injury or edema.     Cervical back: Neck supple.  Skin:    General: Skin is warm and dry.  Neurological:     General: No focal deficit present.     Mental Status: She is alert and oriented to person, place, and time.  Psychiatric:        Mood and Affect: Mood and affect and mood normal.        Behavior: Behavior normal.     ED Results / Procedures / Treatments   Labs (all labs ordered are listed, but only abnormal results are displayed) Labs Reviewed  BASIC METABOLIC PANEL - Abnormal; Notable for the following components:      Result Value   Glucose, Bld 102 (*)    All other components within normal limits  CBC - Abnormal; Notable for the following components:   Hemoglobin 15.4 (*)    Platelets 410 (*)    All other  components within normal limits  RESP PANEL BY RT-PCR (FLU A&B, COVID) ARPGX2  PREGNANCY, URINE  TROPONIN I (HIGH SENSITIVITY)  TROPONIN I (HIGH SENSITIVITY)    EKG EKG Interpretation  Date/Time:  Thursday April 23 2020 15:34:24 EST Ventricular Rate:  69 PR Interval:  126 QRS Duration: 80 QT Interval:  372 QTC Calculation: 398 R Axis:   83 Text Interpretation: Normal sinus rhythm Nonspecific ST abnormality Abnormal ECG Confirmed by [id number] (Marianna Fuss) on 04/23/2020 4:31:37 PM   Radiology DG Chest 2 View  Result Date: 04/23/2020 CLINICAL DATA:  Chest pain, hypertension, intermittent  nausea and vomiting EXAM: CHEST - 2 VIEW COMPARISON:  01/08/2015 FINDINGS: The heart size and mediastinal contours are within normal limits. Both lungs are clear. The visualized skeletal structures are unremarkable. IMPRESSION: No active cardiopulmonary disease. Electronically Signed   By: Sharlet Salina M.D.   On: 04/23/2020 19:11   CT Head Wo Contrast  Result Date: 04/23/2020 CLINICAL DATA:  Subarachnoid hemorrhage suspected.  Headache. EXAM: CT HEAD WITHOUT CONTRAST TECHNIQUE: Contiguous axial images were obtained from the base of the skull through the vertex without intravenous contrast. COMPARISON:  None. FINDINGS: Brain: No evidence of large-territorial acute infarction. No parenchymal hemorrhage. No mass lesion. No extra-axial collection. No mass effect or midline shift. No hydrocephalus. Basilar cisterns are patent. Vascular: No hyperdense vessel. Skull: No acute fracture or focal lesion. Sinuses/Orbits: Paranasal sinuses and mastoid air cells are clear. The orbits are unremarkable. Other: None. IMPRESSION: No acute intracranial abnormality. Electronically Signed   By: Tish Frederickson M.D.   On: 04/23/2020 19:02    Procedures Procedures (including critical care time)  Medications Ordered in ED Medications  hydrALAZINE (APRESOLINE) tablet 25 mg (25 mg Oral Given 04/23/20 1849)   ketorolac (TORADOL) injection 30 mg (30 mg Intramuscular Given 04/23/20 1848)  acetaminophen (TYLENOL) tablet 1,000 mg (1,000 mg Oral Given 04/23/20 1848)    ED Course  I have reviewed the triage vital signs and the nursing notes.  Pertinent labs & imaging results that were available during my care of the patient were reviewed by me and considered in my medical decision making (see chart for details).    MDM Rules/Calculators/A&P                         29 year old presents to ER with concern for headache and high blood pressure.  No neurologic complaints.  No chest pain.  On exam, well-appearing.  EKG without acute ischemic change and troponin within normal limits.  Her basic labs were normal.  CT head negative.  Suspect symptoms related to tension type headache and essential hypertension.  Low suspicion for true hypertensive crisis.  Provided pain medicine as well as single dose of oral hydralazine and her blood pressure and symptoms improved.  Recommended that she have close follow-up with her primary doctor to discuss ongoing management of her blood pressure.  Recommended she return should she have worsening of her symptoms in the interim.   After the discussed management above, the patient was determined to be safe for discharge.  The patient was in agreement with this plan and all questions regarding their care were answered.  ED return precautions were discussed and the patient will return to the ED with any significant worsening of condition.  Final Clinical Impression(s) / ED Diagnoses Final diagnoses:  Hypertension, unspecified type  Tension headache    Rx / DC Orders ED Discharge Orders         Ordered    butalbital-acetaminophen-caffeine (FIORICET) 50-325-40 MG tablet  Every 6 hours PRN        04/23/20 2010           Milagros Loll, MD 04/24/20 1235

## 2020-04-23 NOTE — Discharge Instructions (Signed)
Schedule follow-up appointment with your primary doctor to discuss management of your blood pressure.  Take Tylenol or Motrin for pain control.  Additionally you can take the prescribed medication as needed for your headaches.  Note this can make you somewhat drowsy and should not be taken while driving or operating heavy machinery.

## 2020-04-23 NOTE — ED Triage Notes (Signed)
Pt arrives with c/o elevated BP despite taking Losartan as prescribed. Pt also c/o headache.

## 2021-08-06 IMAGING — CT CT HEAD W/O CM
3 series · 15 of 47 positions shown, 18 images · non-contrast
Comparison: None.

CLINICAL DATA: Subarachnoid hemorrhage suspected.  Headache.

EXAM:
CT HEAD WITHOUT CONTRAST
TECHNIQUE: Contiguous axial images were obtained from the base of the skull
through the vertex without intravenous contrast.

[Series 2: head wo · axial · 0.42mm/px · z∈[+176,+302]mm · 9 of 30 slices shown, 12 images]
[im 3/30  brain]
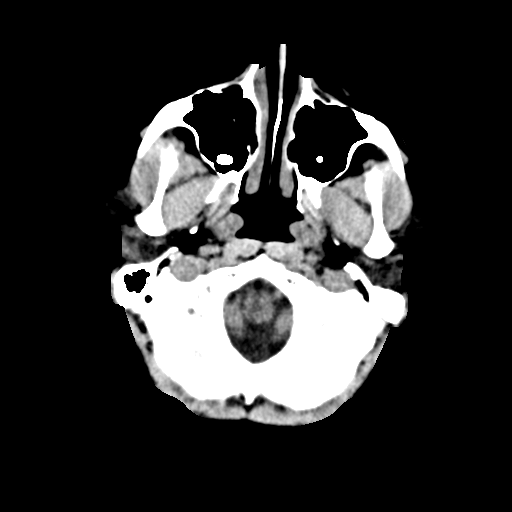
[im 3/30  bone]
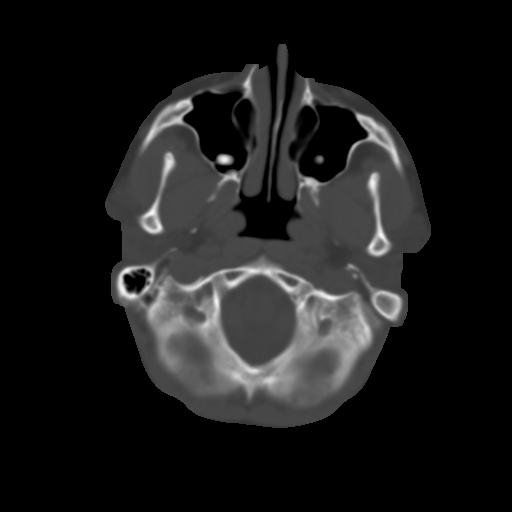
[im 6/30  brain]
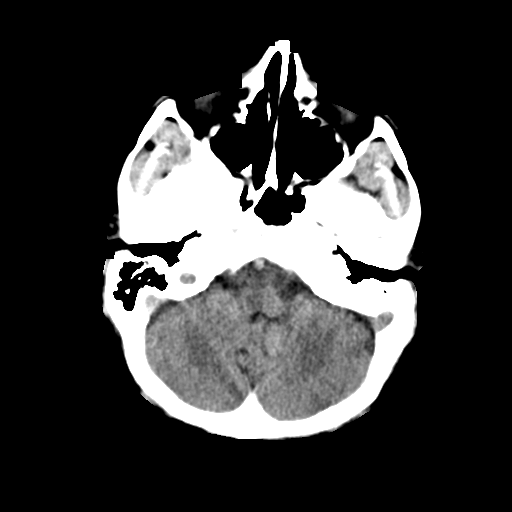
[im 9/30  brain]
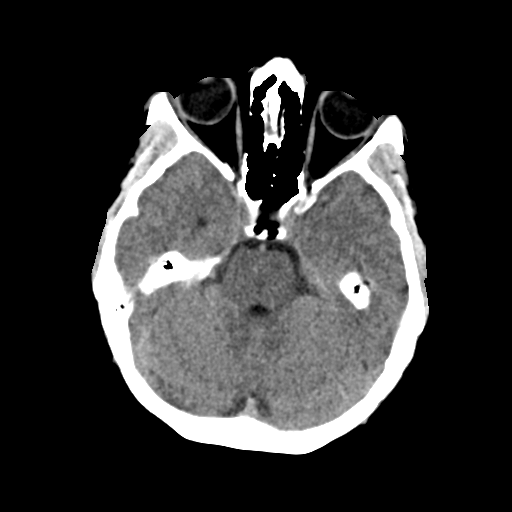
[im 12/30  brain]
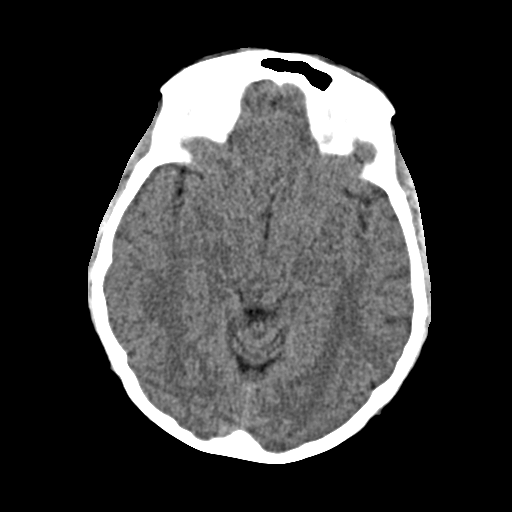
[im 16/30  brain]
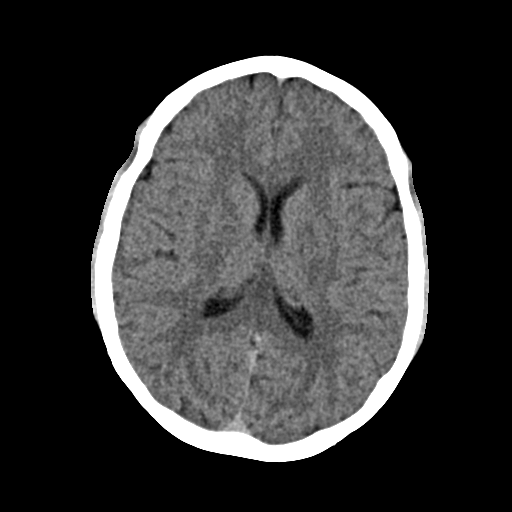
[im 16/30  bone]
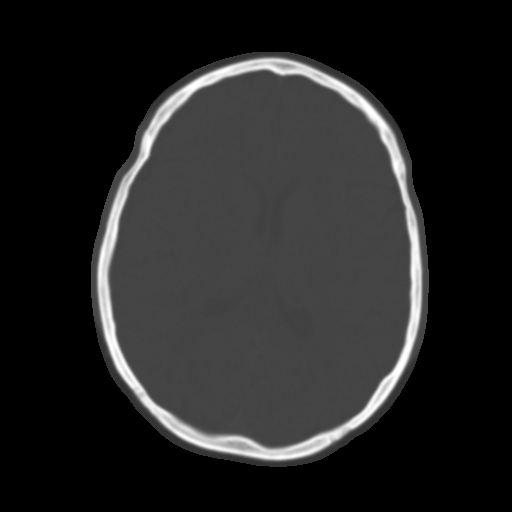
[im 19/30  brain]
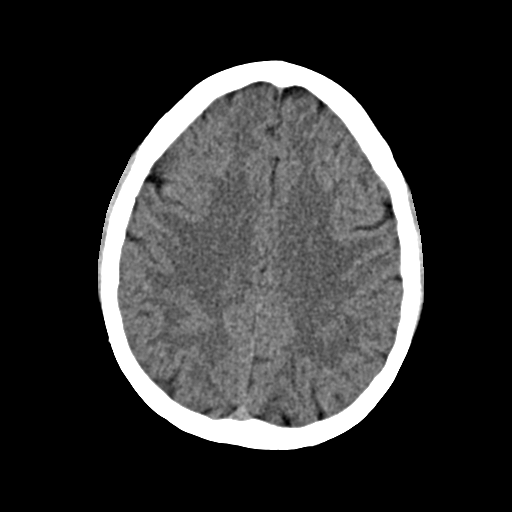
[im 22/30  brain]
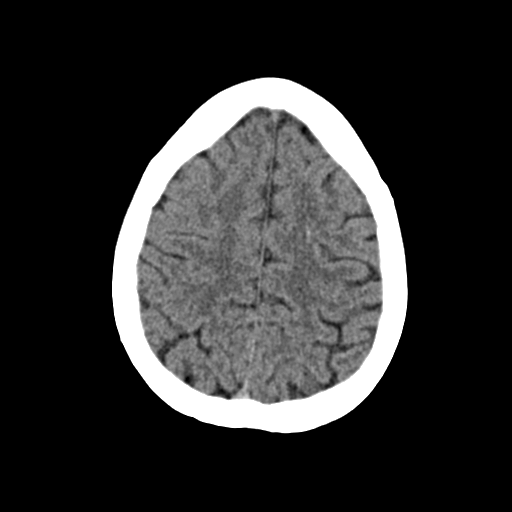
[im 25/30  brain]
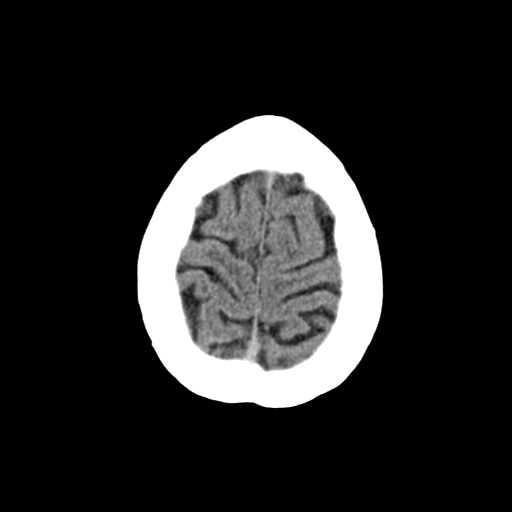
[im 28/30  brain]
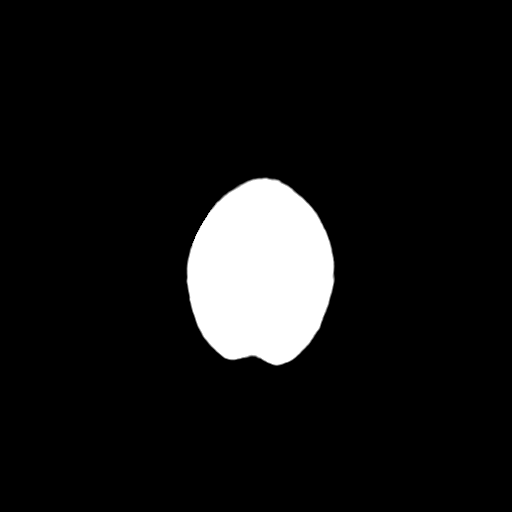
[im 28/30  bone]
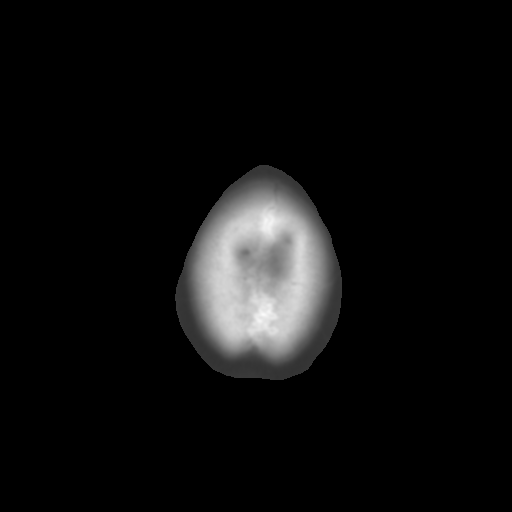

[Series 4: coronal soft · coronal · 0.29mm/px · 3 of 67 slices shown]
[im 23/67  brain]
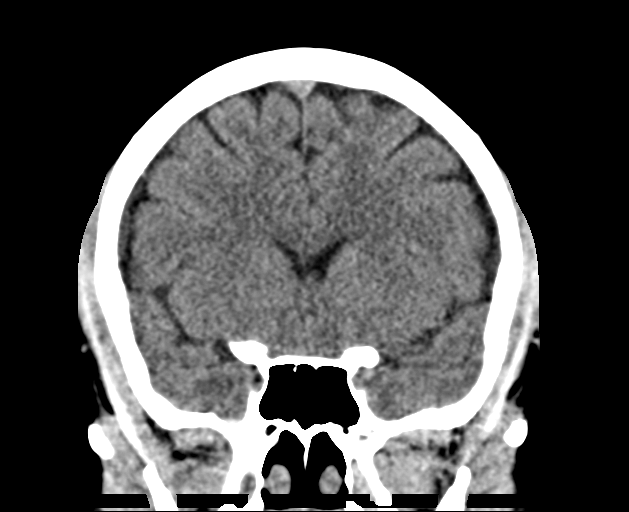
[im 30/67  brain]
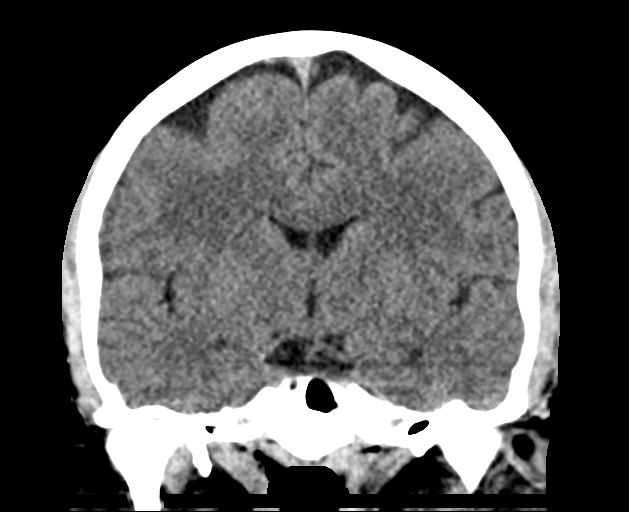
[im 37/67  brain]
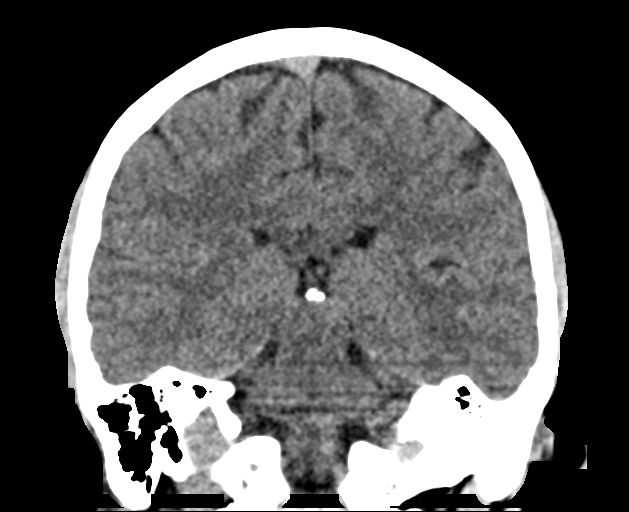

[Series 5: sag soft · sagittal · 0.30mm/px · 3 of 53 slices shown]
[im 18/53  brain]
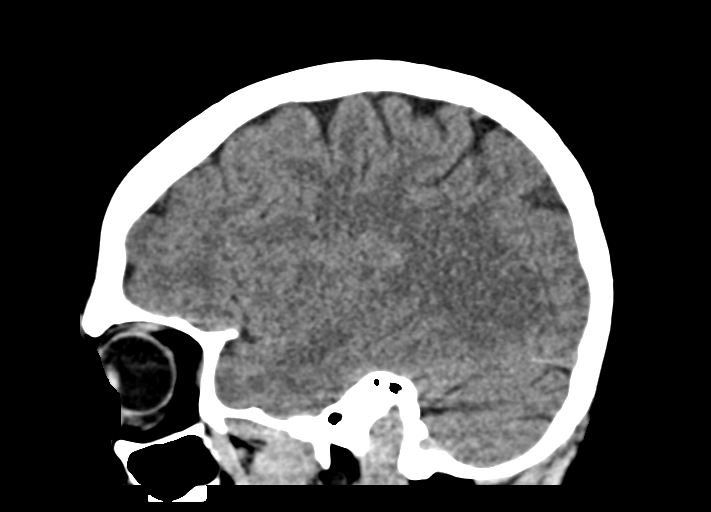
[im 27/53  brain]
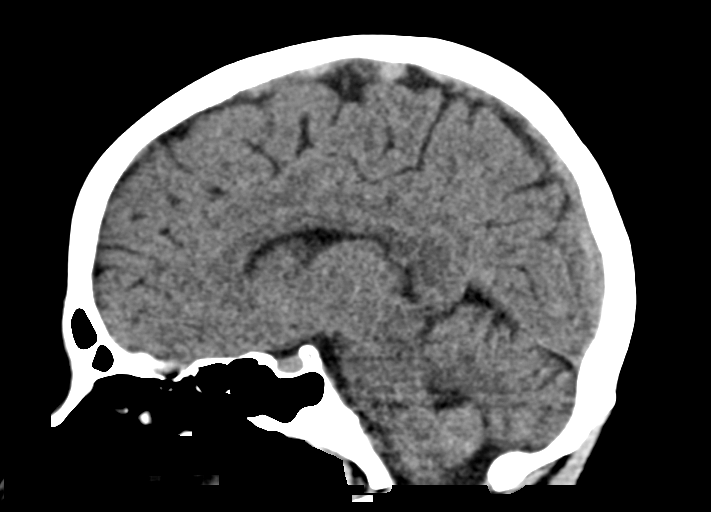
[im 35/53  brain]
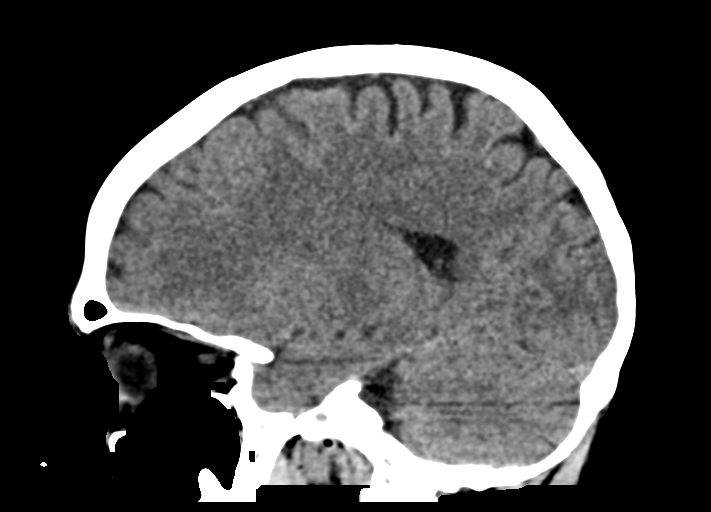

[15 of 47 positions shown; findings below may reference images not displayed]

FINDINGS: Brain:

No evidence of large-territorial acute infarction. No parenchymal
hemorrhage. No mass lesion. No extra-axial collection.

No mass effect or midline shift. No hydrocephalus. Basilar cisterns
are patent.

Vascular: No hyperdense vessel.

Skull: No acute fracture or focal lesion.

Sinuses/Orbits: Paranasal sinuses and mastoid air cells are clear.
The orbits are unremarkable.

Other: None.
IMPRESSION: No acute intracranial abnormality.

## 2022-07-29 ENCOUNTER — Emergency Department (HOSPITAL_BASED_OUTPATIENT_CLINIC_OR_DEPARTMENT_OTHER)
Admission: EM | Admit: 2022-07-29 | Discharge: 2022-07-29 | Disposition: A | Discharge status: Home / Self Care | Payer: Worker's Compensation | Attending: Emergency Medicine | Admitting: Emergency Medicine

## 2022-07-29 ENCOUNTER — Encounter (HOSPITAL_BASED_OUTPATIENT_CLINIC_OR_DEPARTMENT_OTHER): Payer: Self-pay | Admitting: Emergency Medicine

## 2022-07-29 ENCOUNTER — Other Ambulatory Visit: Payer: Self-pay

## 2022-07-29 DIAGNOSIS — R0789 Other chest pain: Secondary | ICD-10-CM | POA: Diagnosis not present

## 2022-07-29 MED ORDER — MELOXICAM 7.5 MG PO TABS: 7.5000 mg | ORAL_TABLET | Freq: Every day | ORAL | 0 refills | Status: AC

## 2022-07-29 MED ORDER — LIDOCAINE 5 % EX PTCH
1.0000 | MEDICATED_PATCH | CUTANEOUS | Status: DC
  Administered 2022-07-29: 1 via TRANSDERMAL
  Filled 2022-07-29: qty 1

## 2022-07-29 NOTE — Discharge Instructions (Addendum)
Your symptoms tonight are consistent with a muscle strain in the left part of the chest.  I have a low concern for a lung injury or broken rib at this time.  Please take the anti-inflammatories if desired.  You may use an arm sling to help with pain as well.  You may also apply ice or heat to the area to help with symptoms.  Please follow-up with your doctor for reassessment if you need to be cleared to return to work.  Return to the emergency department with uncontrolled pain, difficulty breathing, new or changing symptoms.

## 2022-07-29 NOTE — ED Triage Notes (Signed)
Pt reports at work and went to pull something heavy, strained her left side. Limited ROM w left arm d/t axillary and side pain

## 2022-07-29 NOTE — ED Provider Notes (Signed)
[place] EMERGENCY DEPARTMENT AT [facility] Provider Note   CSN: [id number] Arrival date & time: 07/29/22  2108     History  Chief Complaint  Patient presents with   Injury    Kim Brooks is a 32 y.o. female.  Patient presents to the emergency department today for evaluation of left-sided chest wall pain.  This started just prior to arrival.  Patient was reaching up to pull a heavy object and twisted.  She had immediate pain, worse with movement to the left chest below the left axilla.  No difficulty breathing or shortness of breath.  She was sent to the emergency department at the request of her job.  No treatments prior to arrival.  She states that it feels like a "pulled muscle".  Pain is a lot worse with movement of the left arm.       Home Medications Prior to Admission medications   Medication Sig Start Date End Date Taking? Authorizing Provider  meloxicam (MOBIC) 7.5 MG tablet Take 1 tablet (7.5 mg total) by mouth daily. 07/29/22  Yes Carlisle Cater, PA-C  citalopram (CELEXA) 20 MG tablet Take 20 mg by mouth daily.    [provider]  hydrOXYzine (ATARAX/VISTARIL) 25 MG tablet Take 25 mg by mouth 3 (three) times daily as needed for anxiety.    [provider]  losartan-hydrochlorothiazide (HYZAAR) 100-25 MG tablet Take 1 tablet by mouth daily. 02/03/17   Lajean Saver, MD  traZODone (DESYREL) 100 MG tablet Take 100 mg by mouth at bedtime.    [provider]      Allergies    Lisinopril    Review of Systems   Review of Systems  Physical Exam Updated Vital Signs BP (!) 155/108 (BP Location: Right Arm)   Pulse 74   Temp 98.2 F (36.8 C) (Oral)   Resp 20   Ht 5\' 6"  (1.676 m)   Wt 77.1 kg   LMP 07/08/2022 (Approximate)   SpO2 100%   BMI 27.44 kg/m  Physical Exam Vitals and nursing note reviewed.  Constitutional:      Appearance: She is well-developed. She is not diaphoretic.  HENT:     Head: Normocephalic and  atraumatic.     Mouth/Throat:     Mouth: Mucous membranes are not dry.  Eyes:     Conjunctiva/sclera: Conjunctivae normal.  Neck:     Vascular: Normal carotid pulses. No JVD.     Trachea: Trachea normal. No tracheal deviation.  Cardiovascular:     Rate and Rhythm: Normal rate and regular rhythm.     Pulses: No decreased pulses.          Radial pulses are 2+ on the right side and 2+ on the left side.     Heart sounds: Normal heart sounds, S1 normal and S2 normal. No murmur heard. Pulmonary:     Effort: Pulmonary effort is normal. No respiratory distress.     Breath sounds: No wheezing.     Comments: Good air movement bilaterally. Chest:     Chest wall: No tenderness.       Comments: Tenderness to palpation inferior to the left axilla and patient winces with palpation.  No deformities.  No signs of skin trauma. Abdominal:     General: Bowel sounds are normal.     Palpations: Abdomen is soft.     Tenderness: There is no abdominal tenderness. There is no guarding or rebound.  Musculoskeletal:  General: Normal range of motion.     Left shoulder: No tenderness. Normal range of motion.     Cervical back: Normal range of motion and neck supple. No muscular tenderness.     Comments: Limited range of motion of left shoulder due to pain in the chest wall with imaging of the shoulder.  Skin:    General: Skin is warm and dry.     Coloration: Skin is not pale.  Neurological:     Mental Status: She is alert.     ED Results / Procedures / Treatments   Labs (all labs ordered are listed, but only abnormal results are displayed) Labs Reviewed - No data to display  EKG None  Radiology No results found.  Procedures Procedures    Medications Ordered in ED Medications  lidocaine (LIDODERM) 5 % 1 patch (has no administration in time range)    ED Course/ Medical Decision Making/ A&P    Patient seen and examined. History obtained directly from patient. Work-up including  labs, imaging, EKG ordered in triage, if performed, were reviewed.    Labs/EKG: None ordered  Imaging: Offered chest x-ray, patient declines  Medications/Fluids: Ordered: Lidoderm patch.  Offered Toradol shot, patient declines.  Most recent vital signs reviewed and are as follows: BP (!) 155/108 (BP Location: Right Arm)   Pulse 74   Temp 98.2 F (36.8 C) (Oral)   Resp 20   Ht 5\' 6"  (1.676 m)   Wt 77.1 kg   LMP 07/08/2022 (Approximate)   SpO2 100%   BMI 27.44 kg/m   Initial impression: Musculoskeletal pain, left sided chest wall pain  Home treatment plan: RICE protocol, gentle stretching, ice and heat.  Return instructions discussed with patient: Return with worsening symptoms, difficulty breathing, severe uncontrolled pain, new symptoms or other concerns  Follow-up instructions discussed with patient: Follow-up for recheck next week                            Medical Decision Making Risk Prescription drug management.   Patient with musculoskeletal pain and injury after pulling and twisting while at work.  No falls or impact to the ribs.  Low concern for pneumothorax or rib fracture at this time.  Patient declines imaging.  Lung sounds are clear bilaterally.  Pain is worse with movement.  Do not suspect ACS, PE, pneumonia.  The patient's vital signs, pertinent lab work and imaging were reviewed and interpreted as discussed in the ED course. Hospitalization was considered for further testing, treatments, or serial exams/observation. However as patient is well-appearing, has a stable exam, and reassuring studies today, I do not feel that they warrant admission at this time. This plan was discussed with the patient who verbalizes agreement and comfort with this plan and seems reliable and able to return to the Emergency Department with worsening or changing symptoms.          Final Clinical Impression(s) / ED Diagnoses Final diagnoses:  Chest wall pain    Rx / DC  Orders ED Discharge Orders          Ordered    meloxicam (MOBIC) 7.5 MG tablet  Daily        07/29/22 2142              Carlisle Cater, PA-C 07/29/22 2148    Leanord Asal K, DO 07/29/22 2303

## 2022-07-29 NOTE — ED Notes (Signed)
Discharge paperwork reviewed entirely with patient, including Rx's and follow up care. Pain was under control. Pt verbalized understanding as well as all parties involved. No questions or concerns voiced at the time of discharge. No acute distress noted.   Pt ambulated out to PVA without incident or assistance.
